# Patient Record
Sex: Female | Born: 1940 | Race: White | Hispanic: No | Smoking: Former smoker
Health system: Southern US, Community
[De-identification: ages and names within clinical notes are randomized; demographics above are authoritative.]

## PROBLEM LIST (undated history)

## (undated) DIAGNOSIS — F419 Anxiety disorder, unspecified: Secondary | ICD-10-CM

## (undated) DIAGNOSIS — E785 Hyperlipidemia, unspecified: Secondary | ICD-10-CM

## (undated) DIAGNOSIS — I10 Essential (primary) hypertension: Secondary | ICD-10-CM

---

## 2006-02-21 ENCOUNTER — Ambulatory Visit: Payer: Self-pay

## 2007-03-28 ENCOUNTER — Ambulatory Visit: Payer: Self-pay

## 2007-03-31 ENCOUNTER — Ambulatory Visit: Payer: Self-pay

## 2007-06-28 ENCOUNTER — Ambulatory Visit: Payer: Self-pay

## 2008-04-02 ENCOUNTER — Ambulatory Visit: Payer: Self-pay

## 2009-01-25 IMAGING — US ULTRASOUND LEFT BREAST
1 series · 11 of 11 positions shown · non-contrast
Comparison: none

REASON FOR EXAM: Left Breast Density
COMMENTS:

[Series 1: ultrasound left breast · 11 of 11 slices shown]
[im 1/11]
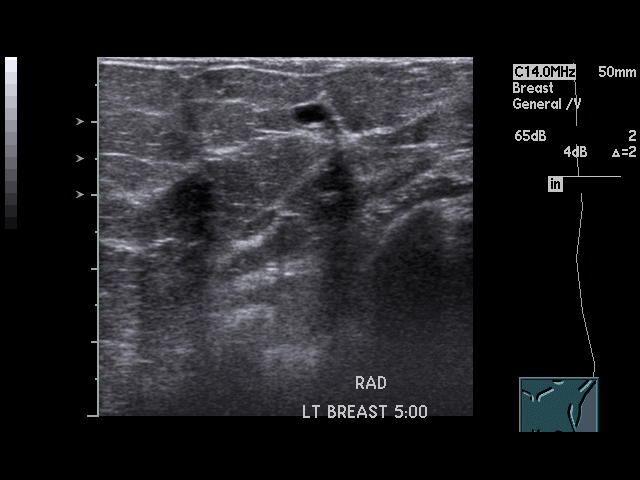
[im 2/11]
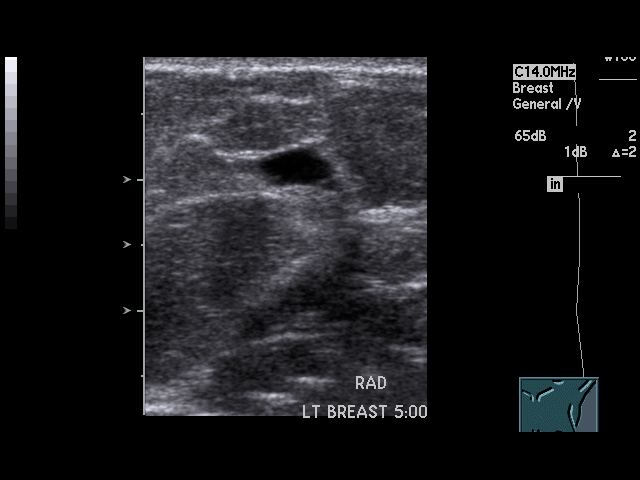
[im 3/11]
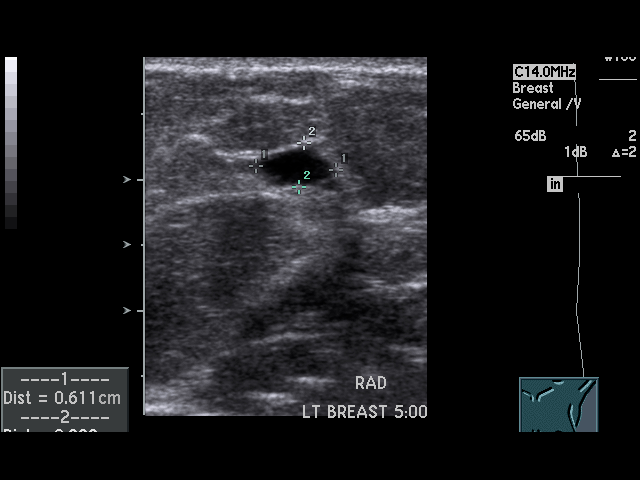
[im 4/11]
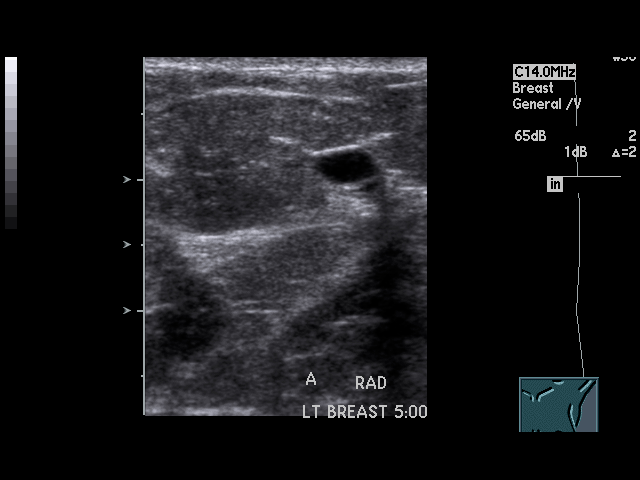
[im 5/11]
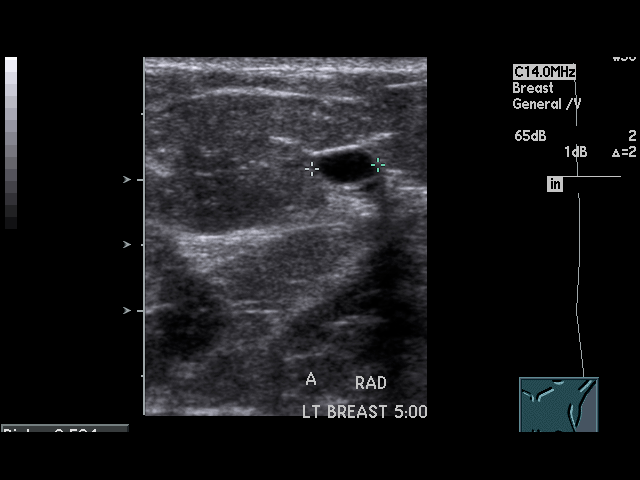
[im 6/11]
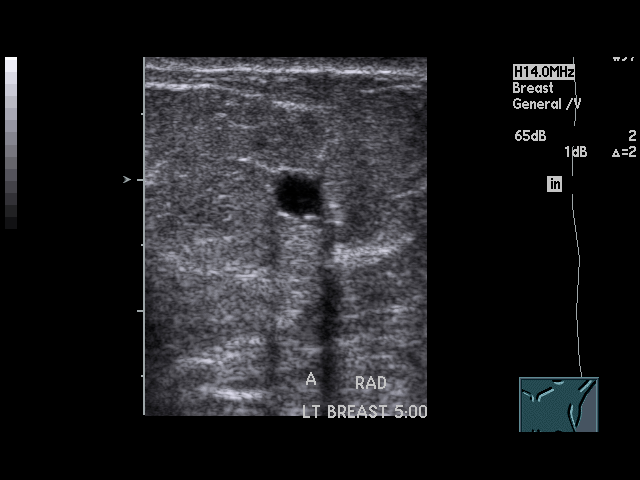
[im 7/11]
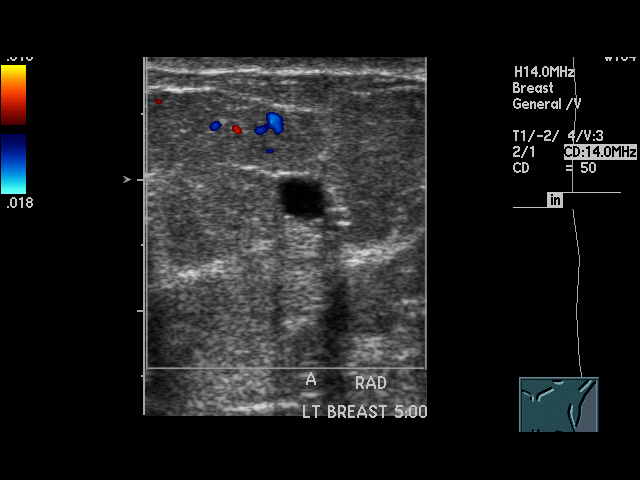
[im 8/11]
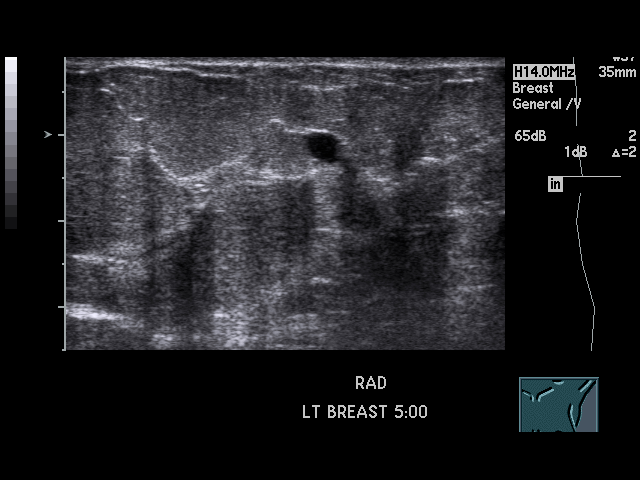
[im 9/11]
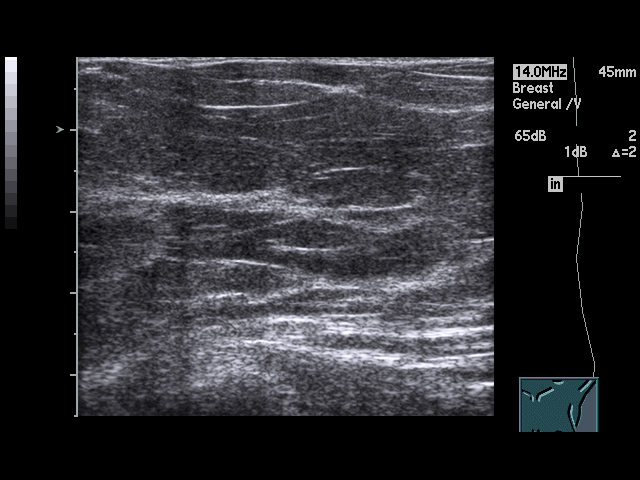
[im 10/11]
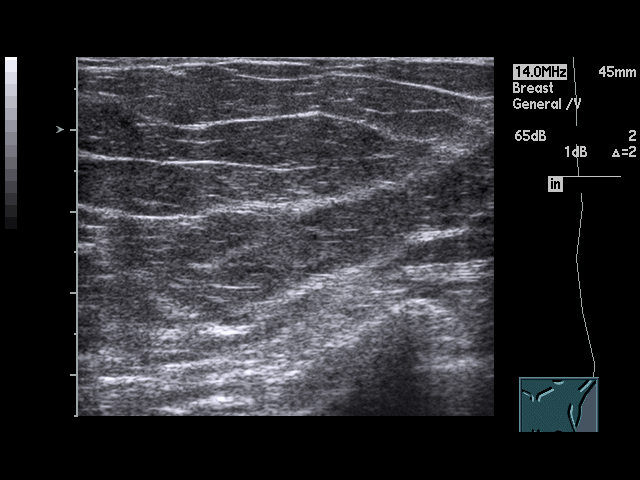
[im 11/11]
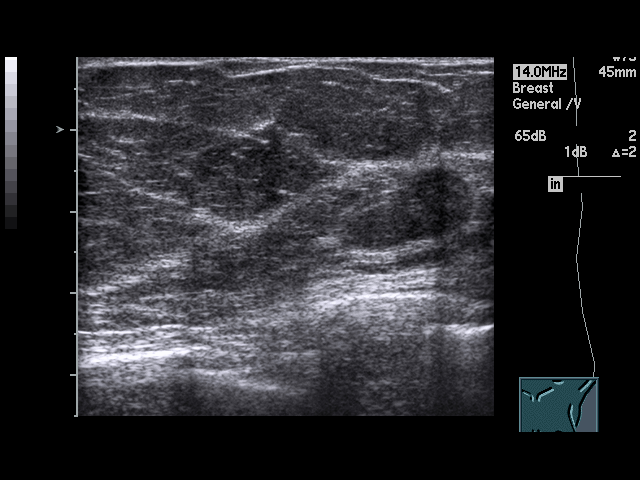

[11 of 11 positions shown; findings below may reference images not displayed]

PROCEDURE:     US  - US BREAST LEFT  - March 31, 2007 [DATE]

RESULT:       Mammographically there is noted a nodule at 5 o'clock in the
LEFT breast.

Ultrasound examination targeted to this site shows a 6.11 mm oval-shaped,
smoothly marginated cyst having posterior enhancement. The findings are
consistent with a simple cyst.  No finding suspicious for malignancy are
seen.
IMPRESSION: 1.     Benign-appearing targeted ultrasound.
2.     BI-RADS:  Category 2-Benign Finding.

A NEGATIVE MAMMOGRAM REPORT DOES NOT PRECLUDE BIOPSY OR OTHER EVALUATION OF
A CLINICALLY PALPABLE OR OTHERWISE SUPSICIOUS MASS OR LESION.  BREAST CANCER
MAY NOT BE DETECTED BY MAMMOGRAPHY IN UP TO 10% OF CASES.

## 2009-04-03 ENCOUNTER — Ambulatory Visit: Payer: Self-pay

## 2009-09-03 ENCOUNTER — Ambulatory Visit: Payer: Self-pay | Admitting: Nurse Practitioner

## 2010-04-07 ENCOUNTER — Ambulatory Visit: Payer: Self-pay | Admitting: Nurse Practitioner

## 2011-04-08 ENCOUNTER — Ambulatory Visit: Payer: Self-pay | Admitting: Nurse Practitioner

## 2011-09-14 ENCOUNTER — Ambulatory Visit: Payer: Self-pay | Admitting: Ophthalmology

## 2011-10-21 ENCOUNTER — Ambulatory Visit: Payer: Self-pay | Admitting: Ophthalmology

## 2011-11-02 ENCOUNTER — Ambulatory Visit: Payer: Self-pay | Admitting: Ophthalmology

## 2012-04-11 ENCOUNTER — Ambulatory Visit: Payer: Self-pay | Admitting: Nurse Practitioner

## 2014-05-07 NOTE — Op Note (Signed)
PATIENT NAME:  Krystal GrumblingCOLLINS, Hadassa N MR#:  [id number] DATE OF BIRTH:  [birth year]  DATE OF PROCEDURE:  11/02/2011  PREOPERATIVE DIAGNOSIS: Visually significant cataract of the left eye.   POSTOPERATIVE DIAGNOSIS: Visually significant cataract of the left eye.   OPERATIVE PROCEDURE: Cataract extraction by phacoemulsification with implant of intraocular lens to left eye.   SURGEON: Galen ManilaWilliam Latrisha Coiro, MD.   ANESTHESIA:  1. Managed anesthesia care.  2. Topical tetracaine drops followed by 2% Xylocaine jelly applied in the preoperative holding area.   COMPLICATIONS: None.   TECHNIQUE:  Stop and chop.   DESCRIPTION OF PROCEDURE: The patient was examined and consented in the preoperative holding area where the aforementioned topical anesthesia was applied to the left eye and then brought back to the Operating Room where the left eye was prepped and draped in the usual sterile ophthalmic fashion and a lid speculum was placed. A paracentesis was created with the side port blade and the anterior chamber was filled with viscoelastic. A near clear corneal incision was performed with the steel keratome. A continuous curvilinear capsulorrhexis was performed with a cystotome followed by the capsulorrhexis forceps. Hydrodissection and hydrodelineation were carried out with BSS on a blunt cannula. The lens was removed in a stop and chop technique and the remaining cortical material was removed with the irrigation-aspiration handpiece. The capsular bag was inflated with viscoelastic and the Tecnis ZCB00-21.5-diopter lens, serial number [id number] was placed in the capsular bag without complication. The remaining viscoelastic was removed from the eye with the irrigation-aspiration handpiece. The wounds were hydrated. The anterior chamber was flushed with Miostat and the eye was inflated to physiologic pressure. The wounds were found to be water tight. The eye was dressed with Vigamox. The patient was given protective glasses  to wear throughout the day and a shield with which to sleep tonight. The patient was also given drops with which to begin a drop regimen today and will follow-up with me in one day.  ____________________________ Jerilee FieldWilliam L. Paz Winsett, MD wlp:slb D: 11/02/2011 11:49:05 ET T: 11/02/2011 11:56:46 ET JOB#: [id number]  cc: Shakiya Mcneary L. Brendia Dampier, MD, <Dictator> Jerilee FieldWILLIAM L Lonie Newsham MD ELECTRONICALLY SIGNED 11/08/2011 13:46

## 2022-12-30 DIAGNOSIS — I609 Nontraumatic subarachnoid hemorrhage, unspecified: Secondary | ICD-10-CM

## 2022-12-30 HISTORY — DX: Nontraumatic subarachnoid hemorrhage, unspecified: I60.9

## 2023-01-07 ENCOUNTER — Encounter: Payer: Self-pay | Admitting: *Deleted

## 2023-01-07 DIAGNOSIS — E041 Nontoxic single thyroid nodule: Secondary | ICD-10-CM

## 2023-01-07 DIAGNOSIS — R911 Solitary pulmonary nodule: Secondary | ICD-10-CM

## 2023-01-07 NOTE — Progress Notes (Signed)
Pt scheduled for new patient visit with Dr. Smith Robert on Mon 01/10/23. Chart reviewed. Noted that imaging was performed at [facility]. Request for images made through power share. Will upload to pt's chart once reviewed for MD review. Will follow up at new pt visit on 12/23. Nothing further needed at this time.

## 2023-01-09 ENCOUNTER — Emergency Department: Payer: Medicare HMO

## 2023-01-09 ENCOUNTER — Other Ambulatory Visit: Payer: Self-pay

## 2023-01-09 ENCOUNTER — Emergency Department
Admission: EM | Admit: 2023-01-09 | Discharge: 2023-01-09 | Disposition: A | Discharge status: Skilled Nursing Facility | Payer: Medicare HMO | Attending: Emergency Medicine | Admitting: Emergency Medicine

## 2023-01-09 ENCOUNTER — Encounter: Payer: Self-pay | Admitting: Emergency Medicine

## 2023-01-09 DIAGNOSIS — I10 Essential (primary) hypertension: Secondary | ICD-10-CM | POA: Diagnosis not present

## 2023-01-09 DIAGNOSIS — K8020 Calculus of gallbladder without cholecystitis without obstruction: Secondary | ICD-10-CM | POA: Insufficient documentation

## 2023-01-09 DIAGNOSIS — I700 Atherosclerosis of aorta: Secondary | ICD-10-CM | POA: Insufficient documentation

## 2023-01-09 DIAGNOSIS — W06XXXA Fall from bed, initial encounter: Secondary | ICD-10-CM | POA: Diagnosis not present

## 2023-01-09 DIAGNOSIS — I2510 Atherosclerotic heart disease of native coronary artery without angina pectoris: Secondary | ICD-10-CM | POA: Diagnosis not present

## 2023-01-09 DIAGNOSIS — R9082 White matter disease, unspecified: Secondary | ICD-10-CM | POA: Diagnosis not present

## 2023-01-09 DIAGNOSIS — R911 Solitary pulmonary nodule: Secondary | ICD-10-CM | POA: Insufficient documentation

## 2023-01-09 DIAGNOSIS — S2241XA Multiple fractures of ribs, right side, initial encounter for closed fracture: Secondary | ICD-10-CM | POA: Diagnosis not present

## 2023-01-09 DIAGNOSIS — G9389 Other specified disorders of brain: Secondary | ICD-10-CM | POA: Insufficient documentation

## 2023-01-09 DIAGNOSIS — S299XXA Unspecified injury of thorax, initial encounter: Secondary | ICD-10-CM | POA: Diagnosis present

## 2023-01-09 DIAGNOSIS — W19XXXA Unspecified fall, initial encounter: Secondary | ICD-10-CM

## 2023-01-09 HISTORY — DX: Essential (primary) hypertension: I10

## 2023-01-09 HISTORY — DX: Hyperlipidemia, unspecified: E78.5

## 2023-01-09 HISTORY — DX: Anxiety disorder, unspecified: F41.9

## 2023-01-09 LAB — BASIC METABOLIC PANEL
Anion gap: 13 (ref 5–15)
BUN: 34 mg/dL — ABNORMAL HIGH (ref 8–23)
CO2: 20 mmol/L — ABNORMAL LOW (ref 22–32)
Calcium: 9.3 mg/dL (ref 8.9–10.3)
Chloride: 104 mmol/L (ref 98–111)
Creatinine, Ser: 1 mg/dL (ref 0.44–1.00)
GFR, Estimated: 56 mL/min — ABNORMAL LOW (ref >60)
Glucose, Bld: 172 mg/dL — ABNORMAL HIGH (ref 70–99)
Potassium: 4.4 mmol/L (ref 3.5–5.1)
Sodium: 137 mmol/L (ref 135–145)

## 2023-01-09 LAB — CBC
HCT: 34.8 % — ABNORMAL LOW (ref 36.0–46.0)
Hemoglobin: 11.7 g/dL — ABNORMAL LOW (ref 12.0–15.0)
MCH: 32.1 pg (ref 26.0–34.0)
MCHC: 33.6 g/dL (ref 30.0–36.0)
MCV: 95.3 fL (ref 80.0–100.0)
Platelets: 243 10*3/uL (ref 150–400)
RBC: 3.65 MIL/uL — ABNORMAL LOW (ref 3.87–5.11)
RDW: 13.1 % (ref 11.5–15.5)
WBC: 7.7 10*3/uL (ref 4.0–10.5)
nRBC: 0 % (ref 0.0–0.2)

## 2023-01-09 LAB — HEPATIC FUNCTION PANEL
ALT: 19 U/L (ref 0–44)
AST: 21 U/L (ref 15–41)
Albumin: 3.6 g/dL (ref 3.5–5.0)
Alkaline Phosphatase: 78 U/L (ref 38–126)
Bilirubin, Direct: <0.1 mg/dL (ref 0.0–0.2)
Total Bilirubin: 0.6 mg/dL (ref <1.2)
Total Protein: 7.4 g/dL (ref 6.5–8.1)

## 2023-01-09 LAB — LIPASE, BLOOD: Lipase: 62 U/L — ABNORMAL HIGH (ref 11–51)

## 2023-01-09 LAB — TROPONIN I (HIGH SENSITIVITY): Troponin I (High Sensitivity): 14 ng/L (ref <18)

## 2023-01-09 MED ORDER — OXYCODONE HCL 5 MG PO TABS: 5.0000 mg | ORAL_TABLET | Freq: Three times a day (TID) | ORAL | 0 refills | Status: DC | PRN

## 2023-01-09 MED ORDER — LIDOCAINE 5 % EX PTCH
1.0000 | MEDICATED_PATCH | CUTANEOUS | Status: DC
  Administered 2023-01-09: 1 via TRANSDERMAL
  Filled 2023-01-09: qty 1

## 2023-01-09 MED ORDER — LIDOCAINE 5 % EX PTCH: 1.0000 | MEDICATED_PATCH | Freq: Two times a day (BID) | CUTANEOUS | 0 refills | Status: DC

## 2023-01-09 MED ORDER — IOHEXOL 300 MG/ML  SOLN
75.0000 mL | Freq: Once | INTRAMUSCULAR | Status: AC | PRN
  Administered 2023-01-09: 65 mL via INTRAVENOUS

## 2023-01-09 MED ORDER — OXYCODONE HCL 5 MG PO TABS
5.0000 mg | ORAL_TABLET | Freq: Once | ORAL | Status: AC
  Administered 2023-01-09: 5 mg via ORAL
  Filled 2023-01-09: qty 1

## 2023-01-09 NOTE — ED Notes (Signed)
[facility] called for transport back to [facility] spoke with Marissa.

## 2023-01-09 NOTE — ED Notes (Signed)
Pt wants to discuss "NOT" being DNR status. MD [place] in formed.

## 2023-01-09 NOTE — ED Triage Notes (Signed)
Pt via [facility] from [facility], reports XR was done confirmed air in the R upper lung. Pt states she has had multiple falls. Denies pain but when she takes a deep breath hr R side hurts. EMS VSS. Pt is A&Ox4 and NAD

## 2023-01-09 NOTE — ED Provider Notes (Signed)
[facility] Provider Note    Event Date/Time   First MD Initiated Contact with Patient 01/09/23 1504     (approximate)   History   Chief Complaint Fall and Shortness of Breath   HPI  Krystal [name] is a 82 y.o. female with past medical history of hypertension, hyperlipidemia, and subarachnoid hemorrhage who presents to the ED complaining of fall.  Patient reports that she lost her balance last night while getting into bed, ended up falling onto her right side.  She hit her head as well as her right chest wall on the floor, denies losing consciousness and does not take a blood thinner.  She has been dealing with pain over her right lower chest wall since then which is worse when she takes a deep breath.  She reports feeling slightly short of breath, but denies any pain in her abdomen.  She had an x-ray performed at her nursing facility earlier today, was told there was "air in my lung" and she was sent to the ED for further evaluation.     Physical Exam   Triage Vital Signs: ED Triage Vitals  Encounter Vitals Group     BP 01/09/23 1448 (!) 140/47     Systolic BP Percentile --      Diastolic BP Percentile --      Pulse Rate 01/09/23 1448 62     Resp 01/09/23 1449 17     Temp 01/09/23 1443 (!) 97.4 F (36.3 C)     Temp Source 01/09/23 1443 Axillary     SpO2 01/09/23 1448 100 %     Weight 01/09/23 1441 96 lb (43.5 kg)     Height 01/09/23 1441 5' (1.524 m)     Head Circumference --      Peak Flow --      Pain Score 01/09/23 1441 0     Pain Loc --      Pain Education --      Exclude from Growth Chart --     Most recent vital signs: Vitals:   01/09/23 1448 01/09/23 1449  BP: (!) 140/47   Pulse: 62   Resp:  17  Temp:    SpO2: 100%     Constitutional: Alert and oriented. Eyes: Conjunctivae are normal. Head: Atraumatic. Nose: No congestion/rhinnorhea. Mouth/Throat: Mucous membranes are moist.  Neck: No midline cervical spine tenderness to  palpation. Cardiovascular: Normal rate, regular rhythm. Grossly normal heart sounds.  2+ radial pulses bilaterally. Respiratory: Normal respiratory effort.  No retractions. Lungs CTAB.  Right chest wall tenderness to palpation noted. Gastrointestinal: Soft and nontender. No distention. Musculoskeletal: No lower extremity tenderness nor edema.  No upper extremity bony tenderness to palpation. Neurologic:  Normal speech and language. No gross focal neurologic deficits are appreciated.    ED Results / Procedures / Treatments   Labs (all labs ordered are listed, but only abnormal results are displayed) Labs Reviewed  BASIC METABOLIC PANEL - Abnormal; Notable for the following components:      Result Value   CO2 20 (*)    Glucose, Bld 172 (*)    BUN 34 (*)    GFR, Estimated 56 (*)    All other components within normal limits  CBC - Abnormal; Notable for the following components:   RBC 3.65 (*)    Hemoglobin 11.7 (*)    HCT 34.8 (*)    All other components within normal limits  LIPASE, BLOOD - Abnormal; Notable for the following  components:   Lipase 62 (*)    All other components within normal limits  HEPATIC FUNCTION PANEL  TROPONIN I (HIGH SENSITIVITY)     EKG  ED ECG REPORT I, Chesley Noon, the attending physician, personally viewed and interpreted this ECG.   Date: 01/09/2023  EKG Time: 14:50  Rate: 63  Rhythm: normal sinus rhythm  Axis: LAD  Intervals:left bundle branch block  ST&T Change: None  RADIOLOGY Chest x-ray reviewed and interpreted by me with elevation of right hemidiaphragm, no focal infiltrate, edema, or effusion noted.  PROCEDURES:  Critical Care performed: No  Procedures   MEDICATIONS ORDERED IN ED: Medications  oxyCODONE (Oxy IR/ROXICODONE) immediate release tablet 5 mg (has no administration in time range)  lidocaine (LIDODERM) 5 % 1 patch (has no administration in time range)  iohexol (OMNIPAQUE) 300 MG/ML solution 75 mL (65 mLs  Intravenous Contrast Given 01/09/23 1624)     IMPRESSION / MDM / ASSESSMENT AND PLAN / ED COURSE  I reviewed the triage vital signs and the nursing notes.                              82 y.o. female with past medical history of hypertension, hyperlipidemia, and subarachnoid hemorrhage who presents to the ED complaining of right chest wall pain after losing her balance and falling last night.  Patient's presentation is most consistent with acute presentation with potential threat to life or bodily function.  Differential diagnosis includes, but is not limited to, intracranial injury, cervical spine injury, rib fracture, hemothorax, pneumothorax, hepatic injury.  Patient uncomfortable but nontoxic-appearing and in no acute distress, vital signs are unremarkable.  There was report of "air in her lung" from chest x-ray performed at her nursing facility, however chest x-ray here in the ED shows no obvious pneumothorax, does show elevation of her the right hemidiaphragm.  We will check CT head and cervical spine, also check CT of her chest/abdomen/pelvis.  EKG shows no evidence of arrhythmia or ischemia and labs are pending at this time.  Patient declines pain medication.  CT head and cervical spine are negative for acute process.  CT chest abdomen and pelvis shows fractures of right ribs 8 through 10 with no associated hemo or pneumothorax.  No evidence of intra-abdominal injury.  No evidence of injury to her extremities.  Labs show no significant anemia, leukocytosis, electrolyte abnormality, or AKI.  Troponin within normal limits and LFTs are also unremarkable.  Patient given dose of oxycodone and Lidoderm patch was placed.  She was offered admission to the hospital but declines, prefers to be discharged back to her rehab facility.  Son is in agreement with plan, she has an appointment at the cancer center tomorrow to follow-up regarding pulmonary nodule seen on today's imaging that she was previously  aware of.  Patient and son counseled to return to the ED for new or worsening symptoms.      FINAL CLINICAL IMPRESSION(S) / ED DIAGNOSES   Final diagnoses:  Closed fracture of multiple ribs of right side, initial encounter  Fall, initial encounter     Rx / DC Orders   ED Discharge Orders          Ordered    oxyCODONE (ROXICODONE) 5 MG immediate release tablet  Every 8 hours PRN        01/09/23 1707    lidocaine (LIDODERM) 5 %  Every 12 hours  01/09/23 1707    AMB  Referral to Pulmonary Nodule Clinic        01/09/23 1708             Note:  This document was prepared using Dragon voice recognition software and may include unintentional dictation errors.   Chesley Noon, MD 01/09/23 [phone]

## 2023-01-10 ENCOUNTER — Inpatient Hospital Stay: Payer: Medicare HMO | Attending: Oncology | Admitting: Oncology

## 2023-01-10 ENCOUNTER — Ambulatory Visit
Admission: RE | Admit: 2023-01-10 | Discharge: 2023-01-10 | Disposition: A | Discharge status: Home / Self Care | Payer: Self-pay | Source: Ambulatory Visit | Attending: Oncology

## 2023-01-10 ENCOUNTER — Encounter: Payer: Self-pay | Admitting: Oncology

## 2023-01-10 ENCOUNTER — Inpatient Hospital Stay: Payer: Medicare HMO

## 2023-01-10 ENCOUNTER — Encounter: Payer: Self-pay | Admitting: *Deleted

## 2023-01-10 VITALS — BP 124/58 | HR 57 | Temp 97.4°F | Resp 18 | Wt 100.0 lb

## 2023-01-10 DIAGNOSIS — E041 Nontoxic single thyroid nodule: Secondary | ICD-10-CM

## 2023-01-10 DIAGNOSIS — R911 Solitary pulmonary nodule: Secondary | ICD-10-CM

## 2023-01-10 NOTE — Progress Notes (Signed)
Met with patient and her son during initial consult with Dr. Smith Robert. All questions answered during visit. Reviewed upcoming appts. Informed will be in touch with further recommendations after PET and tumor board discussion. Son verbalized understanding. Nothing further needed at this time.

## 2023-01-10 NOTE — Addendum Note (Signed)
Addended by: Glory Buff on: 01/10/2023 08:56 AM   Modules accepted: Orders

## 2023-01-13 ENCOUNTER — Encounter: Payer: Self-pay | Admitting: Oncology

## 2023-01-13 NOTE — Progress Notes (Signed)
Hematology/Oncology Consult note [facility] Telephone:([phone] [phone]  Patient Care Team: Keane Police, MD as PCP - General (Family Medicine) Creig Hines, MD as Consulting Physician (Oncology) Glory Buff, RN as Oncology Nurse Navigator   Name of the patient: Krystal [name]  [id number]  Mar 13, 1940    Reason for referral-lung nodule   Referring physician-Dr. Keane Police  Date of visit: 01/13/23   History of presenting illness- Patient is a 82 year old female with a past medical history significant for hypertension hyperlipidemia and longstanding history of smoking who had a fall at home sometime in early December 2024.  Following that patient was in a rehab and had a second fall at the rehab on 01/09/2023 and was brought to the ER.  She underwent CT chest abdomen and pelvis with contrast at that time which incidentally showed a spiculated nodule in the inferior right middle lobe measuring 1.8 x 1.5 cm.  No evidence of enlarged mediastinal hilar or axillary adenopathy or distant metastatic disease.  Minimally displaced fractures of the posterolateral right 8th through 10th ribs.  Prescribed as needed oxycodone for pain from her rib fracture and therefore appears significantly drowsy today.  She is easily arousable.  She is here with her son.  Prior to her initial fall in early December 2024 patient was living alone and was independent of her ADLs and IADLs.  Since her last couple of falls her mobility has decreased  ECOG PS- 3  Pain scale- 4   Review of systems- Review of Systems  Constitutional:  Positive for malaise/fatigue. Negative for chills, fever and weight loss.  HENT:  Negative for congestion, ear discharge and nosebleeds.   Eyes:  Negative for blurred vision.  Respiratory:  Negative for cough, hemoptysis, sputum production, shortness of breath and wheezing.   Cardiovascular:  Negative for chest pain,  palpitations, orthopnea and claudication.  Gastrointestinal:  Negative for abdominal pain, blood in stool, constipation, diarrhea, heartburn, melena, nausea and vomiting.  Genitourinary:  Negative for dysuria, flank pain, frequency, hematuria and urgency.  Musculoskeletal:  Negative for back pain, joint pain and myalgias.  Skin:  Negative for rash.  Neurological:  Negative for dizziness, tingling, focal weakness, seizures, weakness and headaches.  Endo/Heme/Allergies:  Does not bruise/bleed easily.  Psychiatric/Behavioral:  Negative for depression and suicidal ideas. The patient does not have insomnia.     No Known Allergies  There are no active problems to display for this patient.    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 No pertinent surgical history.  Social History   Socioeconomic History   Marital status: Divorced    Spouse name: Not on file   Number of children: Not on file   Years of education: Not on file   Highest education level: Not on file  Occupational History   Not on file  Tobacco Use   Smoking status: Former    Current packs/day: 0.00    Average packs/day: 1 pack/day for 24.0 years (24.0 ttl pk-yrs)    Types: Cigarettes    Start date: 77    Quit date: 21    Years since quitting: 41.0   Smokeless tobacco: Never  Vaping Use   Vaping status: Never Used  Substance and Sexual Activity   Alcohol use: Never   Drug use: Yes    Types: Oxycodone  Comment: for pain management due to fall   Sexual activity: Not on file  Other Topics Concern   Not on file  Social History Narrative   Not on file   Social Drivers of Health   Financial Resource Strain: Low Risk  (12/27/2022)   Received from [facility]   Overall Financial Resource Strain (CARDIA)    Difficulty of Paying Living Expenses: Not hard at all  Food Insecurity: No Food  Insecurity (01/10/2023)   Hunger Vital Sign    Worried About Running Out of Food in the Last Year: Never true    Ran Out of Food in the Last Year: Never true  Transportation Needs: No Transportation Needs (01/10/2023)   PRAPARE - Administrator, Civil Service (Medical): No    Lack of Transportation (Non-Medical): No  Physical Activity: Not on file  Stress: Not on file  Social Connections: Not on file  Intimate Partner Violence: Not At Risk (01/10/2023)   Humiliation, Afraid, Rape, and Kick questionnaire    Fear of Current or Ex-Partner: No    Emotionally Abused: No    Physically Abused: No    Sexually Abused: No     Family History  Problem Relation Age of Onset   Heart disease Mother    Diabetes Father    Heart disease Father    Breast cancer Sister    Breast cancer Sister      Current Outpatient Medications:    alendronate (FOSAMAX) 70 MG tablet, Take 70 mg by mouth once a week., Disp: , Rfl:    amLODipine (NORVASC) 5 MG tablet, Take 1 tablet by mouth daily., Disp: , Rfl:    aspirin EC 81 MG tablet, Take 81 mg by mouth daily., Disp: , Rfl:    diclofenac Sodium (VOLTAREN) 1 % GEL, Apply 2 g topically 4 (four) times daily., Disp: , Rfl:    enalapril (VASOTEC) 20 MG tablet, Take 0.5 tablets by mouth daily., Disp: , Rfl:    lidocaine (LIDODERM) 5 %, Place 1 patch onto the skin every 12 (twelve) hours. Remove & Discard patch within 12 hours or as directed by MD, Disp: 10 patch, Rfl: 0   Multiple Vitamins-Minerals (PRESERVISION AREDS) CAPS, Take 1 capsule by mouth daily., Disp: , Rfl:    Nutritional Supplements (SALMON OIL PO), Take 1 capsule by mouth daily., Disp: , Rfl:    Omega-3 1000 MG CAPS, Take 1 capsule by mouth daily., Disp: , Rfl:    oxyCODONE (ROXICODONE) 5 MG immediate release tablet, Take 1 tablet (5 mg total) by mouth every 8 (eight) hours as needed., Disp: 20 tablet, Rfl: 0   pregabalin (LYRICA) 25 MG capsule, Take 25 mg by mouth 2 (two) times daily.,  Disp: , Rfl:    sertraline (ZOLOFT) 50 MG tablet, Take 1 tablet by mouth daily., Disp: , Rfl:    simvastatin (ZOCOR) 20 MG tablet, Take 1 tablet by mouth at bedtime., Disp: , Rfl:    Physical exam:  Vitals:   01/10/23 1047  BP: (!) 124/58  Pulse: (!) 57  Resp: 18  Temp: (!) 97.4 F (36.3 C)  Weight: 100 lb (45.4 kg)   Physical Exam Cardiovascular:     Rate and Rhythm: Normal rate and regular rhythm.     Heart sounds: Murmur heard.  Pulmonary:     Effort: Pulmonary effort is normal.     Breath sounds: Normal breath sounds.  Abdominal:     General: Bowel sounds are normal.  Palpations: Abdomen is soft.  Skin:    General: Skin is warm and dry.  Neurological:     Mental Status: She is alert and oriented to person, place, and time.           Latest Ref Rng & Units 01/09/2023    3:00 PM  CMP  Total Protein 6.5 - 8.1 g/dL 7.4   Total Bilirubin <0.4 mg/dL 0.6   Alkaline Phos 38 - 126 U/L 78   AST 15 - 41 U/L 21   ALT 0 - 44 U/L 19       Latest Ref Rng & Units 01/09/2023    2:56 PM  CBC  WBC 4.0 - 10.5 K/uL 7.7   Hemoglobin 12.0 - 15.0 g/dL 54.0   Hematocrit 98.1 - 46.0 % 34.8   Platelets 150 - 400 K/uL 243     No images are attached to the encounter.  CT CHEST ABDOMEN PELVIS W CONTRAST Result Date: 01/09/2023 CLINICAL DATA:  Multiple falls, pain * Tracking Code: BO * EXAM: CT CHEST, ABDOMEN, AND PELVIS WITH CONTRAST TECHNIQUE: Multidetector CT imaging of the chest, abdomen and pelvis was performed following the standard protocol during bolus administration of intravenous contrast. RADIATION DOSE REDUCTION: This exam was performed according to the departmental dose-optimization program which includes automated exposure control, adjustment of the mA and/or kV according to patient size and/or use of iterative reconstruction technique. CONTRAST:  65mL OMNIPAQUE IOHEXOL 300 MG/ML  SOLN COMPARISON:  None Available. FINDINGS: CT CHEST FINDINGS Cardiovascular: Aortic  atherosclerosis. Dense aortic valve calcifications. Normal heart size. Left coronary artery calcifications. No pericardial effusion. Mediastinum/Nodes: No enlarged mediastinal, hilar, or axillary lymph nodes. Trachea and esophagus demonstrate no significant findings. Lungs/Pleura: Lobulated, finely spiculated appearing nodule of the inferior right middle lobe measuring 1.8 x 1.5 cm (series 4, image 80). Elevation of the right hemidiaphragm with associated scarring or atelectasis. No pleural effusion or pneumothorax. Musculoskeletal: No chest wall abnormality. Minimally displaced fractures of the posterolateral right eighth through tenth ribs (series 4, image 110). CT ABDOMEN PELVIS FINDINGS Hepatobiliary: No solid liver abnormality is seen. Gallstones. No gallbladder wall thickening, or biliary dilatation. Pancreas: Unremarkable. No pancreatic ductal dilatation or surrounding inflammatory changes. Spleen: Normal in size without significant abnormality. Adrenals/Urinary Tract: Adrenal glands are unremarkable. Kidneys are normal, without renal calculi, solid lesion, or hydronephrosis. Small volume air within the urinary bladder, presumably secondary to recent catheterization Stomach/Bowel: Stomach is within normal limits. Appendix not clearly visualized. No evidence of bowel wall thickening, distention, or inflammatory changes. Vascular/Lymphatic: Aortic atherosclerosis. No enlarged abdominal or pelvic lymph nodes. Reproductive: Status post hysterectomy. Other: No abdominal wall hernia or abnormality. No ascites. Musculoskeletal: No acute osseous findings. 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 Decubitus ulcerations (series 2, image 111). IMPRESSION: 1. Minimally displaced fractures of the posterolateral right eighth through tenth ribs. No pneumothorax. 2. No CT evidence of acute traumatic injury to the organs of the chest, abdomen, or pelvis. 3. Lobulated, finely  spiculated appearing nodule of the inferior right middle lobe measuring 1.8 x 1.5 cm. This is concerning for malignancy. Recommend multidisciplinary thoracic oncology referral on a nonemergent, outpatient basis for consideration of PET-CT, bronchoscopy, and tissue sampling as clinically appropriate. 4. Cholelithiasis. 5. Dense aortic valve calcifications. Correlate for echocardiographic evidence of aortic valve dysfunction. 6. Coronary artery disease. 7. Decubitus ulcerations. Aortic Atherosclerosis (ICD10-I70.0). Electronically Signed   By: Jearld Lesch M.D.   On: 01/09/2023 16:52   CT Head Wo Contrast Result Date:  01/09/2023 CLINICAL DATA:  Multiple falls right-sided chest pain EXAM: CT HEAD WITHOUT CONTRAST CT CERVICAL SPINE WITHOUT CONTRAST TECHNIQUE: Multidetector CT imaging of the head and cervical spine was performed following the standard protocol without intravenous contrast. Multiplanar CT image reconstructions of the cervical spine were also generated. RADIATION DOSE REDUCTION: This exam was performed according to the departmental dose-optimization program which includes automated exposure control, adjustment of the mA and/or kV according to patient size and/or use of iterative reconstruction technique. COMPARISON:  None Available. FINDINGS: CT HEAD FINDINGS Brain: No evidence of acute infarction, hemorrhage, hydrocephalus, extra-axial collection or mass lesion/mass effect. 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 Mild periventricular white matter hypodensity. Vascular: No hyperdense vessel or unexpected calcification. Skull: Normal. Negative for fracture or focal lesion. Sinuses/Orbits: No acute finding. Other: None. CT CERVICAL SPINE FINDINGS Alignment: Degenerative straightening of the normal cervical lordosis. Skull base and vertebrae: No acute fracture. No primary bone lesion or focal pathologic process. Soft tissues and spinal canal: No prevertebral fluid or  swelling. No visible canal hematoma. Disc levels: Moderate disc space height loss and osteophytosis from C5-C7 with otherwise preserved disc spaces. Upper chest: Negative. Other: None. IMPRESSION: 1. No acute intracranial pathology. 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 2. No fracture or static subluxation of the cervical spine. 3. 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 Electronically Signed   By: Jearld Lesch M.D.   On: 01/09/2023 16:43   CT Cervical Spine Wo Contrast Result Date: 01/09/2023 CLINICAL DATA:  Multiple falls right-sided chest pain EXAM: CT HEAD WITHOUT CONTRAST CT CERVICAL SPINE WITHOUT CONTRAST TECHNIQUE: Multidetector CT imaging of the head and cervical spine was performed following the standard protocol without intravenous contrast. Multiplanar CT image reconstructions of the cervical spine were also generated. RADIATION DOSE REDUCTION: This exam was performed according to the departmental dose-optimization program which includes automated exposure control, adjustment of the mA and/or kV according to patient size and/or use of iterative reconstruction technique. COMPARISON:  None Available. FINDINGS: CT HEAD FINDINGS Brain: No evidence of acute infarction, hemorrhage, hydrocephalus, extra-axial collection or mass lesion/mass effect. 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 Mild periventricular white matter hypodensity. Vascular: No hyperdense vessel or unexpected calcification. Skull: Normal. Negative for fracture or focal lesion. Sinuses/Orbits: No acute finding. Other: None. CT CERVICAL SPINE FINDINGS Alignment: Degenerative straightening of the normal cervical lordosis. Skull base and vertebrae: No acute fracture. No primary bone lesion or focal pathologic process. Soft tissues and spinal canal: No prevertebral fluid or swelling. No visible canal  hematoma. Disc levels: Moderate disc space height loss and osteophytosis from C5-C7 with otherwise preserved disc spaces. Upper chest: Negative. Other: None. IMPRESSION: 1. No acute intracranial pathology. 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 2. No fracture or static subluxation of the cervical spine. 3. 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 Electronically Signed   By: Jearld Lesch M.D.   On: 01/09/2023 16:43   DG Chest Portable 1 View Result Date: 01/09/2023 CLINICAL DATA:  rib pain chest pain, multiple falls EXAM: PORTABLE CHEST 1 VIEW COMPARISON:  None Available. FINDINGS: Normal heart size. Normal mediastinal contour. No pneumothorax. No pleural effusion. Mild-to-moderate elevation of the right hemidiaphragm. No pulmonary edema. Mild passive right lung base atelectasis. No consolidative airspace disease. No displaced fractures in the visualized chest. IMPRESSION: Mild-to-moderate elevation of the  right hemidiaphragm with mild passive right lung base atelectasis. Electronically Signed   By: Delbert Phenix M.D.   On: 01/09/2023 15:26    Assessment and plan- Patient is a 82 y.o. female referred for spiculated mass middle lobe lung nodule  I have reviewed CT chest abdomen and pelvis images independently and discussed findings with the patient which shows a spiculated lung nodule about 1.8 cm in the right middle lobe concerning for malignancy.  I am referring her to pulmonary for consideration for possible bronchoscopy.  She does have aortic stenosis and a significant murmur on exam and may need cardiology clearance prior to procedure.  I am also scheduling PET CT scan to complete her staging workup.  Based on pulmonary recommendations we will need to decide if patient can get bronchoscopy followed by consideration for SBRT versus empiric SBRT if she is deemed to be high risk for bronchoscopy.  She does  not require any oxygen at baseline.  Follow-up with me to be decided based on pulmonary recommendations and PET scan   Thank you for this kind referral and the opportunity to participate in the care of this patient   Visit Diagnosis 1. Lung nodule   2. Thyroid nodule     Dr. Owens Shark, MD, MPH [facility] [phone] 01/13/2023

## 2023-01-17 ENCOUNTER — Encounter
Admission: RE | Admit: 2023-01-17 | Discharge: 2023-01-17 | Disposition: A | Discharge status: Home / Self Care | Payer: Medicare HMO | Source: Ambulatory Visit | Attending: Oncology | Admitting: Oncology

## 2023-01-17 DIAGNOSIS — R911 Solitary pulmonary nodule: Secondary | ICD-10-CM | POA: Insufficient documentation

## 2023-01-17 DIAGNOSIS — X58XXXA Exposure to other specified factors, initial encounter: Secondary | ICD-10-CM | POA: Diagnosis not present

## 2023-01-17 DIAGNOSIS — E041 Nontoxic single thyroid nodule: Secondary | ICD-10-CM | POA: Insufficient documentation

## 2023-01-17 DIAGNOSIS — S2241XA Multiple fractures of ribs, right side, initial encounter for closed fracture: Secondary | ICD-10-CM | POA: Insufficient documentation

## 2023-01-17 LAB — GLUCOSE, CAPILLARY: Glucose-Capillary: 83 mg/dL (ref 70–99)

## 2023-01-17 MED ORDER — FLUDEOXYGLUCOSE F - 18 (FDG) INJECTION
5.6400 | Freq: Once | INTRAVENOUS | Status: AC | PRN
  Administered 2023-01-17: 5.64 via INTRAVENOUS

## 2023-01-25 ENCOUNTER — Ambulatory Visit: Payer: Medicare PPO | Admitting: Pulmonary Disease

## 2023-01-25 ENCOUNTER — Encounter: Payer: Self-pay | Admitting: Pulmonary Disease

## 2023-01-25 VITALS — BP 130/80 | HR 59 | Temp 97.3°F | Ht 60.0 in | Wt 100.8 lb

## 2023-01-25 DIAGNOSIS — I350 Nonrheumatic aortic (valve) stenosis: Secondary | ICD-10-CM

## 2023-01-25 DIAGNOSIS — R54 Age-related physical debility: Secondary | ICD-10-CM | POA: Diagnosis not present

## 2023-01-25 DIAGNOSIS — R911 Solitary pulmonary nodule: Secondary | ICD-10-CM

## 2023-01-25 NOTE — Progress Notes (Signed)
 Subjective:    Patient ID: Krystal [name], female    DOB: [birth year], 83 y.o.   MRN: [id number]  Patient Care Team: Krystal Gladden, MD as PCP - General (Family Medicine) Krystal Krystal BROCKS, MD as Consulting Physician (Oncology) Krystal Gills, RN as Oncology Nurse Navigator Krystal Aran, MD as Consulting Physician (Radiation Oncology)  Chief Complaint  Patient presents with   Consult    Nodule. No SOB, wheezing or cough.    BACKGROUND: 83 year old remote former smoker with a 24-pack-year history of smoking who presents for evaluation of a right lower lobe lung nodule for consideration of robotic assisted bronchoscopy.  Patient has a history of very severe aortic stenosis, generalized weakness and frailty and recent current falls.  Patient presents with her son in law today.  She is kindly referred by Dr. Annah Krystal.   HPI Discussed the use of AI scribe software for clinical note transcription with the patient, who gave verbal consent to proceed.  History of Present Illness   Krystal [name], an 83 year old former smoker, presents for evaluation of an incidentally noted lung nodule on a recent CT chest. The patient has a history of severe aortic stenosis and has recently experienced frequent falls, which are likely related to the aortic stenosis. The patient's mobility is impaired, with difficulty in foot coordination and strength, particularly during pivoting movements. This has resulted in several falls, requiring assistance to regain an upright position.  The patient's recent falls led to the incidental discovery of a lung nodule during imaging. The patient's family reports that in the past three to four months, the patient has experienced episodes of dizziness and feeling hot, necessitating sitting down. These symptoms are likely related to the patient's severe aortic stenosis.  The patient's cardiac condition has been monitored by a cardiologist for several years, with an  upcoming appointment scheduled. The patient has no reported skin sores or breakdowns. The patient's family has been assisting with various appointments and medical management.   The patient does not endorse any cough, no sputum production, no hemoptysis.  No lower extremity edema.  Her mobility is limited and she requires a walker for assistance and even with that she is very tentative.     Review of Systems A 10 point review of systems was performed and it is as noted above otherwise negative.   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 No pertinent surgical history.  There are no active problems to display for this patient.   Family History  Problem Relation Age of Onset   Heart disease Mother    Diabetes Father    Heart disease Father    Breast cancer Sister    Breast cancer Sister     Social History   Tobacco Use   Smoking status: Former    Current packs/day: 0.00    Average packs/day: 1 pack/day for 24.0 years (24.0 ttl pk-yrs)    Types: Cigarettes    Start date: 17    Quit date: 1984    Years since quitting: 41.0   Smokeless tobacco: Never  Substance Use Topics   Alcohol use: Never    Allergies  Allergen Reactions   Rsv Mrna Pre-F Virus Vaccine     Body aches and pain, nausea and weakness    Current Meds  Medication Sig   alendronate (FOSAMAX) 70 MG tablet Take 70  mg by mouth once a week.   amLODipine (NORVASC) 5 MG tablet Take 1 tablet by mouth daily.   aspirin  EC 81 MG tablet Take 81 mg by mouth daily.   diclofenac Sodium (VOLTAREN) 1 % GEL Apply 2 g topically 4 (four) times daily.   enalapril (VASOTEC) 20 MG tablet Take 0.5 tablets by mouth daily.   lidocaine  (LIDODERM ) 5 % Place 1 patch onto the skin every 12 (twelve) hours. Remove & Discard patch within 12 hours or as directed by MD   Multiple Vitamins-Minerals (PRESERVISION AREDS) CAPS  Take 1 capsule by mouth daily.   Nutritional Supplements (SALMON OIL PO) Take 1 capsule by mouth daily.   Omega-3 1000 MG CAPS Take 1 capsule by mouth daily.   oxyCODONE  (ROXICODONE ) 5 MG immediate release tablet Take 1 tablet (5 mg total) by mouth every 8 (eight) hours as needed.   pregabalin  (LYRICA ) 25 MG capsule Take 25 mg by mouth 2 (two) times daily.   sertraline  (ZOLOFT ) 50 MG tablet Take 1 tablet by mouth daily.   simvastatin  (ZOCOR ) 20 MG tablet Take 1 tablet by mouth at bedtime.    Immunization History  Administered Date(s) Administered   Influenza-Unspecified 09/11/2022   Moderna Covid-19 Fall Seasonal Vaccine 10yrs & older 11/03/2021   Moderna Sars-Covid-2 Vaccination 08/13/2019, 09/12/2019, 06/11/2020   Unspecified SARS-COV-2 Vaccination 10/13/2020        Objective:     BP 130/80 (BP Location: Right Arm, Cuff Size: Normal)   Pulse (!) 59   Temp (!) 97.3 F (36.3 C)   Ht 5' (1.524 m)   Wt 100 lb 12.8 oz (45.7 kg)   SpO2 96%   BMI 19.69 kg/m   SpO2: 96 % O2 Device: None (Room air)  GENERAL: Frail, debilitated appearing woman, no acute respiratory distress, she ambulates with assistance of a walker.  Gait is unsteady. HEAD: Normocephalic, atraumatic.  EYES: Pupils equal, round, reactive to light.  No scleral icterus.  MOUTH: Oral mucosa moist.  No thrush. NECK: Supple. No thyromegaly. Trachea midline. No JVD.  No adenopathy. PULMONARY: Good air entry bilaterally.  No adventitious sounds. CARDIOVASCULAR: S1 and S2. Regular rate and rhythm.  Grade 3/6 to 4/6 aortic stenosis murmur. ABDOMEN: Benign. MUSCULOSKELETAL: Significant kyphosis.  No joint deformity, no clubbing, no edema.  NEUROLOGIC: Unsteady gait, requires walker for assistance.  Poor balance.  Speech is fluent.  No overt localizing sign. SKIN: Intact,warm,dry. PSYCH: Mood and behavior normal.   Representative image from CT performed on 10 January 2023 at [facility] showing right lower lobe lung  nodule:   Representative image of PET/CT performed 17 January 2023 showing lesion on the right lower lobe being PET avid:    Assessment & Plan:     ICD-10-CM   1. Lung nodule seen on imaging study  R91.1    Likely carcinoma, PET avid    2. Symptomatic severe aortic stenosis with normal ejection fraction  I35.0     3. 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 Biopsy is high risk due to severe aortic stenosis and anatomical considerations. Radiation therapy recommended as a safer alternative, involving five targeted treatments, avoiding general anesthesia risks such as precipitous blood pressure drop and stroke. Radiation oncologist Dr. Lenn is experienced in treating patients who cannot undergo biopsy. - Refer to Dr. Chrystal for radiation therapy consultation -  Schedule five radiation treatments  Severe Aortic Stenosis Severe aortic stenosis with a very narrow valve, monitored by cardiologist Dr. Sedalia. Recent dizziness and falls likely related to this condition. High risk for general anesthesia due to potential for precipitous blood pressure drop and stroke. Valve repair possible but risky given age and comorbidities. - Continue follow-up with cardiologist Dr. Sedalia - Monitor for symptoms of dizziness and falls  Frequent Falls Frequent falls likely due to severe aortic stenosis and lower extremity weakness. Requires assistance to get up after falls. Emphasized importance of using a walker to prevent falls. - Encourage use of walker to prevent falls - Monitor for further falls and assess need for additional support  General Health Maintenance No new sores or pressure ulcers noted. Previous sacral area breakdown appears healed. - Monitor for new sores or pressure ulcers  Follow-up - Keep appointment with cardiologist on March 25 - Return to  clinic if any new lung issues arise - Send message to [facility], RN, oncology Lung Cancer Navigator, to schedule appointment with Dr. Lenn. - Additionally, patient will be discussed at Tumor Board on 9 January     Advised if symptoms do not improve or worsen, to please contact office for sooner follow up or seek emergency care.    I spent 60 minutes of dedicated to the care of this patient on the date of this encounter to include pre-visit review of records, face-to-face time with the patient discussing conditions above, post visit ordering of testing, clinical documentation with the electronic health record, making appropriate referrals as documented, and communicating necessary findings to members of the patients care team.   C. Leita Sanders, MD Advanced Bronchoscopy PCCM [facility]    *This note was dictated using voice recognition software/Dragon.  Despite best efforts to proofread, errors can occur which can change the meaning. Any transcriptional errors that result from this process are unintentional and may not be fully corrected at the time of dictation.

## 2023-01-25 NOTE — Patient Instructions (Addendum)
 VISIT SUMMARY:  During today's visit, we discussed the incidental finding of a lung nodule on your recent CT chest scan, which has shown increased activity on a PET CT scan. We also reviewed your severe aortic stenosis and its impact on your recent falls and episodes of dizziness. Additionally, we talked about the importance of using a walker to prevent further falls and maintaining your general health.  YOUR PLAN:  -LUNG NODULE: A lung nodule is a small growth in the lung that can be benign or malignant. Your recent scans suggest a high risk of malignancy. Due to the high risk associated with a biopsy, we recommend radiation therapy as a safer alternative.  We are referring you to Dr. Chrystal, for consideration of a type of radiation called SBRT which is conducted in 5 treatments.  -SEVERE AORTIC STENOSIS: Severe aortic stenosis is a condition where the heart's aortic valve is very narrow, making it difficult for blood to flow through. This condition is likely causing/contributing to your dizziness and falls. We will continue to monitor this with your cardiologist, Dr. Sedalia, and watch for any new symptoms.  -FREQUENT FALLS: Your frequent falls are likely due to or contributed to by your severe aortic stenosis and lower extremity weakness. It is important to use a walker to help prevent falls. We will continue to monitor your condition and assess if you need additional support.  -GENERAL HEALTH MAINTENANCE: No new sores or pressure ulcers were noted, and your previous sacral area breakdown appears healed. We will continue to monitor for any new sores or pressure ulcers.  INSTRUCTIONS:  Please keep your appointment with your cardiologist, Dr. Sedalia, on March 25. We will also send a message to [facility], the navigator, to schedule your appointment with Dr. Chrystal for radiation therapy. Return to the clinic if you experience any new lung issues.

## 2023-01-27 ENCOUNTER — Other Ambulatory Visit: Payer: Medicare HMO

## 2023-01-28 ENCOUNTER — Encounter: Payer: Self-pay | Admitting: *Deleted

## 2023-01-28 NOTE — Progress Notes (Signed)
 Phone call made to pt's son, Marcey, to review recommendations from tumor board. All questions answered during call. Reviewed upcoming appt to see Dr. Lenn on 1/14 at 10am to discuss radiation therapy. Marcey confirmed appt. Instructed to call back with any questions or needs. Marcey verbalized understanding. Nothing further needed at this time.

## 2023-02-01 ENCOUNTER — Encounter: Payer: Self-pay | Admitting: *Deleted

## 2023-02-01 ENCOUNTER — Ambulatory Visit
Admission: RE | Admit: 2023-02-01 | Discharge: 2023-02-01 | Disposition: A | Discharge status: Home / Self Care | Payer: Medicare PPO | Source: Ambulatory Visit | Attending: Radiation Oncology | Admitting: Radiation Oncology

## 2023-02-01 ENCOUNTER — Encounter: Payer: Self-pay | Admitting: Radiation Oncology

## 2023-02-01 VITALS — BP 136/85 | HR 62 | Temp 97.0°F | Resp 12 | Wt 101.0 lb

## 2023-02-01 DIAGNOSIS — C3481 Malignant neoplasm of overlapping sites of right bronchus and lung: Secondary | ICD-10-CM | POA: Insufficient documentation

## 2023-02-01 DIAGNOSIS — R911 Solitary pulmonary nodule: Secondary | ICD-10-CM

## 2023-02-01 NOTE — Progress Notes (Signed)
 Met with patient during initial consult with Dr. Lenn. All questions answered during visit. Reviewed upcoming appts. Instructed to call with any further questions or needs. Pt and son-in-law, Marcey, verbalized understanding. Nothing further needed at this time.

## 2023-02-01 NOTE — Consult Note (Signed)
 NEW PATIENT EVALUATION  Name: Krystal [name]  MRN: [id number]  Date:   02/01/2023     DOB: 1941/01/11   This 83 y.o. female patient presents to the clinic for initial evaluation of probable stage I non-small cell lung cancer of the right middle lobe.  REFERRING PHYSICIAN: Melanee Annah BROCKS, MD  CHIEF COMPLAINT:  Chief Complaint  Patient presents with   Lung Lesion    DIAGNOSIS: The encounter diagnosis was Lung nodule.   PREVIOUS INVESTIGATIONS:  CT scans PET CT scan reviewed Clinical notes reviewed Labs reviewed Case presented at weekly tumor conference  HPI: Patient is a 83 year old female nonsurgical candidate who has been followed by a right middle lobe nodule that was discovered incidentally at the time of a fall at home in early December which included a CT scan of her chest.  At that time there was 1.8 x 1.5 cm spiculated nodule in the inferior right middle lobe.  No evidence of hilar or mediastinal adenopathy was noted.  She did based on the fall have displaced fractures of the posterior right 8th through 10th ribs.  PET CT scan was performed showing hypermetabolic activity consistent with primary bronchogenic carcinoma.  Based on her age and difficulty in biopsy not thought to be a surgical candidate case was presented at tumor board with recommendation for SBRT treatment.  She is seen today and is doing fairly well.  Specifically [place] cough hemoptysis chest tightness.  PLANNED TREATMENT REGIMEN: SBRT  PAST MEDICAL HISTORY:  has a past medical history of Anxiety, HTN (hypertension), Hyperlipidemia, and Subarachnoid hemorrhage (HCC) (12/30/2022).    PAST SURGICAL HISTORY: History reviewed. No pertinent surgical history.  FAMILY HISTORY: family history includes Breast cancer in her sister and sister; Diabetes in her father; Heart disease in her father and mother.  SOCIAL HISTORY:  reports that she quit smoking about 41 years ago. Her smoking use included cigarettes. She started  smoking about 65 years ago. She has a 24 pack-year smoking history. She has never used smokeless tobacco. She reports current drug use. Drug: Oxycodone . She reports that she does not drink alcohol.  ALLERGIES: Rsv mrna pre-f virus vaccine  MEDICATIONS:  Current Outpatient Medications  Medication Sig Dispense Refill   alendronate (FOSAMAX) 70 MG tablet Take 70 mg by mouth once a week.     amLODipine (NORVASC) 5 MG tablet Take 1 tablet by mouth daily.     aspirin  EC 81 MG tablet Take 81 mg by mouth daily.     diclofenac Sodium (VOLTAREN) 1 % GEL Apply 2 g topically 4 (four) times daily.     enalapril (VASOTEC) 20 MG tablet Take 0.5 tablets by mouth daily.     lidocaine  (LIDODERM ) 5 % Place 1 patch onto the skin every 12 (twelve) hours. Remove & Discard patch within 12 hours or as directed by MD 10 patch 0   Multiple Vitamins-Minerals (PRESERVISION AREDS) CAPS Take 1 capsule by mouth daily.     Nutritional Supplements (SALMON OIL PO) Take 1 capsule by mouth daily.     Omega-3 1000 MG CAPS Take 1 capsule by mouth daily.     pregabalin  (LYRICA ) 25 MG capsule Take 25 mg by mouth 2 (two) times daily.     sertraline  (ZOLOFT ) 50 MG tablet Take 1 tablet by mouth daily.     simvastatin  (ZOCOR ) 20 MG tablet Take 1 tablet by mouth at bedtime.     oxyCODONE  (ROXICODONE ) 5 MG immediate release tablet Take 1 tablet (5 mg total)  by mouth every 8 (eight) hours as needed. (Patient not taking: Reported on 02/01/2023) 20 tablet 0   No current facility-administered medications for this encounter.    ECOG PERFORMANCE STATUS:  0 - Asymptomatic  REVIEW OF SYSTEMS: Patient denies any weight loss, fatigue, weakness, fever, chills or night sweats. Patient denies any loss of vision, blurred vision. Patient denies any ringing  of the ears or hearing loss. No irregular heartbeat. Patient denies heart murmur or history of fainting. Patient denies any chest pain or pain radiating to her upper extremities. Patient denies  any shortness of breath, difficulty breathing at night, cough or hemoptysis. Patient denies any swelling in the lower legs. Patient denies any nausea vomiting, vomiting of blood, or coffee ground material in the vomitus. Patient denies any stomach pain. Patient states has had normal bowel movements no significant constipation or diarrhea. Patient denies any dysuria, hematuria or significant nocturia. Patient denies any problems walking, swelling in the joints or loss of balance. Patient denies any skin changes, loss of hair or loss of weight. Patient denies any excessive worrying or anxiety or significant depression. Patient denies any problems with insomnia. Patient denies excessive thirst, polyuria, polydipsia. Patient denies any swollen glands, patient denies easy bruising or easy bleeding. Patient denies any recent infections, allergies or URI. Patient s visual fields have not changed significantly in recent time. PHYSICAL EXAM: BP 136/85   Pulse 62   Temp (!) 97 F (36.1 C)   Resp 12   Wt 101 lb (45.8 kg)   BMI 19.73 kg/m  Thin female in NAD.  Does have some facial bruising from her recent fall.  Well-developed well-nourished patient in NAD. HEENT reveals PERLA, EOMI, discs not visualized.  Oral cavity is clear. No oral mucosal lesions are identified. Neck is clear without evidence of cervical or supraclavicular adenopathy. Lungs are clear to A&P. Cardiac examination is essentially unremarkable with regular rate and rhythm without murmur rub or thrill. Abdomen is benign with no organomegaly or masses noted. Motor sensory and DTR levels are equal and symmetric in the upper and lower extremities. Cranial nerves II through XII are grossly intact. Proprioception is intact. No peripheral adenopathy or edema is identified. No motor or sensory levels are noted. Crude visual fields are within normal range.  LABORATORY DATA: Labs reviewed    RADIOLOGY RESULTS: CT scans and PET CT scans reviewed  compatible with above-stated findings   IMPRESSION: Stage I non-small cell lung cancer of the lower portion of the right middle lobe in 83 year old female  PLAN: At this time I have offered SBRT treatment to this lesion.  I would plan on delivering 54 Gray in 3 fractions.  Would use motion restriction and 3-dimensional treatment planning.  Risks and benefits of treatment including possible fatigue possible development slight cough all were discussed in detail with the patient.  She seems to comprehend the recommendations well has consented to treatment.  I have personally set up and ordered CT simulation.  I would like to take this opportunity to thank you for allowing me to participate in the care of your patient.SABRA Marcey Penton, MD

## 2023-02-02 ENCOUNTER — Inpatient Hospital Stay: Payer: Medicare PPO | Attending: Oncology | Admitting: Hospice and Palliative Medicine

## 2023-02-02 DIAGNOSIS — E041 Nontoxic single thyroid nodule: Secondary | ICD-10-CM

## 2023-02-02 DIAGNOSIS — R911 Solitary pulmonary nodule: Secondary | ICD-10-CM

## 2023-02-02 NOTE — Progress Notes (Signed)
 Multidisciplinary Oncology Council Documentation  Krystal [name] was presented by our [facility] on 02/02/2023, which included representatives from:  Palliative Care Dietitian  Physical/Occupational Therapist Nurse Navigator Genetics Social work Survivorship RN Financial Navigator Research RN   Krystal [name] currently presents with history of lung mass  We reviewed previous medical and familial history, history of present illness, and recent lab results along with all available histopathologic and imaging studies. The MOC considered available treatment options and made the following recommendations/referrals:  SW  The MOC is a meeting of clinicians from various specialty areas who evaluate and discuss patients for whom a multidisciplinary approach is being considered. Final determinations in the plan of care are those of the provider(s).   Today's extended care, comprehensive team conference, Krystal [name] was not present for the discussion and was not examined.

## 2023-02-04 ENCOUNTER — Inpatient Hospital Stay: Payer: Medicare PPO

## 2023-02-04 NOTE — Progress Notes (Signed)
[facility] Clinical Social Work  Initial Assessment   Krystal [name] is a 83 y.o. year old female contacted by phone. Clinical Social Work was referred by  [facility]  for assessment of psychosocial needs.   SDOH (Social Determinants of Health) assessments performed: Yes   SDOH Screenings   Food Insecurity: No Food Insecurity (01/10/2023)  Housing: Unknown (01/10/2023)  Transportation Needs: No Transportation Needs (01/10/2023)  Utilities: Not At Risk (01/10/2023)  Depression (PHQ2-9): Low Risk  (01/10/2023)  Financial Resource Strain: Low Risk  (12/27/2022)   Received from [facility]  Tobacco Use: Medium Risk (02/01/2023)  Health Literacy: Medium Risk (01/29/2021)   Received from [facility]     Distress Screen completed: No    01/10/2023   10:40 AM  ONCBCN DISTRESS SCREENING  Screening Type Initial Screening  Distress experienced in past week (1-10) 0      Family/Social Information:  Housing Arrangement: patient lives alone.  Two of her son-in-laws live close by and assist her. Family members/support persons in your life? 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  Income source: Actor concerns: No Type of concern: None Food access concerns: no Religious or spiritual practice: No Services Currently in place:  Medicare  Coping/ Adjustment to diagnosis: Patient understands treatment plan and what happens next? yes Concerns about diagnosis and/or treatment:  Patient denied. Patient reported stressors:  Patient denied. Hopes and/or priorities: Family Patient enjoys sewing/ knitting Current coping skills/ strengths: Capable of independent living , Manufacturing systems engineer , Contractor , General fund of knowledge , and Supportive family/friends     SUMMARY: Current SDOH Barriers:  Patient denied.  Clinical Social Work Clinical Goal(s):  No clinical social work goals at this  time  Interventions: Discussed common feeling and emotions when being diagnosed with cancer, and the importance of support during treatment Informed patient of the support team roles and support services at [facility] Provided CSW contact information and encouraged patient to call with any questions or concerns Provided patient with information about the [employer].  Mailed her literature.   Follow Up Plan: CSW will follow-up with patient by phone  Patient verbalizes understanding of plan: Yes    Dorothey Baseman, LCSW Clinical Social Worker [facility]

## 2023-02-08 ENCOUNTER — Other Ambulatory Visit: Payer: Self-pay | Admitting: Radiation Oncology

## 2023-02-08 DIAGNOSIS — C801 Malignant (primary) neoplasm, unspecified: Secondary | ICD-10-CM

## 2023-02-10 ENCOUNTER — Ambulatory Visit
Admission: RE | Admit: 2023-02-10 | Discharge: 2023-02-10 | Disposition: A | Discharge status: Home / Self Care | Payer: Medicare PPO | Source: Ambulatory Visit | Attending: Radiation Oncology | Admitting: Radiation Oncology

## 2023-02-10 ENCOUNTER — Encounter: Payer: Self-pay | Admitting: *Deleted

## 2023-02-10 DIAGNOSIS — C3481 Malignant neoplasm of overlapping sites of right bronchus and lung: Secondary | ICD-10-CM | POA: Diagnosis present

## 2023-02-10 DIAGNOSIS — C801 Malignant (primary) neoplasm, unspecified: Secondary | ICD-10-CM

## 2023-02-14 ENCOUNTER — Telehealth: Payer: Self-pay | Admitting: *Deleted

## 2023-02-14 DIAGNOSIS — R911 Solitary pulmonary nodule: Secondary | ICD-10-CM

## 2023-02-14 NOTE — Telephone Encounter (Signed)
-----   Message from Creig Hines sent at 02/10/2023 10:26 AM EST ----- Regarding: RE: Follow up Ct chest without contrast 2 months port radiation and see me 2 weeks post scans ----- Message ----- From: Glory Buff, RN Sent: 02/10/2023   9:18 AM EST To: Creig Hines, MD Subject: Follow up                                      Pt will finish radiation on 2/17. Do you want to schedule any follow up with her when she finishes?  Mayme Genta

## 2023-02-14 NOTE — Telephone Encounter (Signed)
Orders placed. Message sent to scheduling to schedule and notify pt with appt.

## 2023-02-16 DIAGNOSIS — C3481 Malignant neoplasm of overlapping sites of right bronchus and lung: Secondary | ICD-10-CM | POA: Diagnosis not present

## 2023-02-21 ENCOUNTER — Other Ambulatory Visit: Payer: Self-pay

## 2023-02-21 ENCOUNTER — Ambulatory Visit
Admission: RE | Admit: 2023-02-21 | Discharge: 2023-02-21 | Disposition: A | Discharge status: Home / Self Care | Payer: Medicare PPO | Source: Ambulatory Visit | Attending: Radiation Oncology | Admitting: Radiation Oncology

## 2023-02-21 DIAGNOSIS — C3481 Malignant neoplasm of overlapping sites of right bronchus and lung: Secondary | ICD-10-CM | POA: Insufficient documentation

## 2023-02-21 LAB — RAD ONC ARIA SESSION SUMMARY
Course Elapsed Days: 0
Plan Fractions Treated to Date: 1
Plan Prescribed Dose Per Fraction: 12 Gy
Plan Total Fractions Prescribed: 5
Plan Total Prescribed Dose: 60 Gy
Reference Point Dosage Given to Date: 12 Gy
Reference Point Session Dosage Given: 12 Gy
Session Number: 1

## 2023-02-23 ENCOUNTER — Ambulatory Visit
Admission: RE | Admit: 2023-02-23 | Discharge: 2023-02-23 | Disposition: A | Discharge status: Home / Self Care | Payer: Medicare PPO | Source: Ambulatory Visit | Attending: Radiation Oncology | Admitting: Radiation Oncology

## 2023-02-23 ENCOUNTER — Encounter: Payer: Self-pay | Admitting: *Deleted

## 2023-02-23 ENCOUNTER — Other Ambulatory Visit: Payer: Self-pay

## 2023-02-23 DIAGNOSIS — C3481 Malignant neoplasm of overlapping sites of right bronchus and lung: Secondary | ICD-10-CM | POA: Diagnosis not present

## 2023-02-23 LAB — RAD ONC ARIA SESSION SUMMARY
Course Elapsed Days: 2
Plan Fractions Treated to Date: 2
Plan Prescribed Dose Per Fraction: 12 Gy
Plan Total Fractions Prescribed: 5
Plan Total Prescribed Dose: 60 Gy
Reference Point Dosage Given to Date: 24 Gy
Reference Point Session Dosage Given: 12 Gy
Session Number: 2

## 2023-02-28 ENCOUNTER — Other Ambulatory Visit: Payer: Self-pay

## 2023-02-28 ENCOUNTER — Ambulatory Visit
Admission: RE | Admit: 2023-02-28 | Discharge: 2023-02-28 | Disposition: A | Discharge status: Home / Self Care | Payer: Medicare PPO | Source: Ambulatory Visit | Attending: Radiation Oncology | Admitting: Radiation Oncology

## 2023-02-28 DIAGNOSIS — C3481 Malignant neoplasm of overlapping sites of right bronchus and lung: Secondary | ICD-10-CM | POA: Diagnosis not present

## 2023-02-28 LAB — RAD ONC ARIA SESSION SUMMARY
Course Elapsed Days: 7
Plan Fractions Treated to Date: 3
Plan Prescribed Dose Per Fraction: 12 Gy
Plan Total Fractions Prescribed: 5
Plan Total Prescribed Dose: 60 Gy
Reference Point Dosage Given to Date: 36 Gy
Reference Point Session Dosage Given: 12 Gy
Session Number: 3

## 2023-03-02 ENCOUNTER — Other Ambulatory Visit: Payer: Self-pay

## 2023-03-02 ENCOUNTER — Ambulatory Visit
Admission: RE | Admit: 2023-03-02 | Discharge: 2023-03-02 | Disposition: A | Discharge status: Home / Self Care | Payer: Medicare PPO | Source: Ambulatory Visit | Attending: Radiation Oncology | Admitting: Radiation Oncology

## 2023-03-02 DIAGNOSIS — C3481 Malignant neoplasm of overlapping sites of right bronchus and lung: Secondary | ICD-10-CM | POA: Diagnosis not present

## 2023-03-02 LAB — RAD ONC ARIA SESSION SUMMARY
Course Elapsed Days: 9
Plan Fractions Treated to Date: 4
Plan Prescribed Dose Per Fraction: 12 Gy
Plan Total Fractions Prescribed: 5
Plan Total Prescribed Dose: 60 Gy
Reference Point Dosage Given to Date: 48 Gy
Reference Point Session Dosage Given: 12 Gy
Session Number: 4

## 2023-03-07 ENCOUNTER — Other Ambulatory Visit: Payer: Self-pay

## 2023-03-07 ENCOUNTER — Ambulatory Visit
Admission: RE | Admit: 2023-03-07 | Discharge: 2023-03-07 | Disposition: A | Discharge status: Home / Self Care | Payer: Medicare PPO | Source: Ambulatory Visit | Attending: Radiation Oncology | Admitting: Radiation Oncology

## 2023-03-07 DIAGNOSIS — C3481 Malignant neoplasm of overlapping sites of right bronchus and lung: Secondary | ICD-10-CM | POA: Diagnosis not present

## 2023-03-07 LAB — RAD ONC ARIA SESSION SUMMARY
Course Elapsed Days: 14
Plan Fractions Treated to Date: 5
Plan Prescribed Dose Per Fraction: 12 Gy
Plan Total Fractions Prescribed: 5
Plan Total Prescribed Dose: 60 Gy
Reference Point Dosage Given to Date: 60 Gy
Reference Point Session Dosage Given: 12 Gy
Session Number: 5

## 2023-03-08 NOTE — Radiation Completion Notes (Signed)
 Patient Name: Krystal [name], BOURQUIN MRN: [id number] Date of Birth: 04-25-1940 Referring Physician: Owens Shark, M.D. Date of Service: 2023-03-08 Radiation Oncologist: Carmina Miller, M.D. [facility] -                              RADIATION ONCOLOGY END OF TREATMENT NOTE     Diagnosis: C34.31 Malignant neoplasm of lower lobe, right bronchus or lung Intent: Curative     HPI: Patient is a 83 year old female nonsurgical candidate who has been followed by a right middle lobe nodule that was discovered incidentally at the time of a fall at home in early December which included a CT scan of her chest.  At that time there was 1.8 x 1.5 cm spiculated nodule in the inferior right middle lobe.  No evidence of hilar or mediastinal adenopathy was noted.  She did based on the fall have displaced fractures of the posterior right 8th through 10th ribs.  PET CT scan was performed showing hypermetabolic activity consistent with primary bronchogenic carcinoma.  Based on her age and difficulty in biopsy not thought to be a surgical candidate case was presented at tumor board with recommendation for SBRT treatment.  She is seen today and is doing fairly well.  Specifically Nuys cough hemoptysis chest tightness.      ==========DELIVERED PLANS==========  First Treatment Date: 2023-02-21 Last Treatment Date: 2023-03-07   Plan Name: Lung_R_SBRT Site: Lung, Right Technique: SBRT/SRT-IMRT Mode: Photon Dose Per Fraction: 12 Gy Prescribed Dose (Delivered / Prescribed): 60 Gy / 60 Gy Prescribed Fxs (Delivered / Prescribed): 5 / 5     ==========ON TREATMENT VISIT DATES========== 2023-02-21, 2023-02-23, 2023-02-28, 2023-02-28, 2023-03-02, 2023-03-07     ==========UPCOMING VISITS==========       ==========APPENDIX - ON TREATMENT VISIT NOTES==========   See weekly On Treatment Notes in Epic for details in the Media tab (listed as Progress notes on the On Treatment Visit Dates listed above).

## 2023-04-13 ENCOUNTER — Ambulatory Visit
Admission: RE | Admit: 2023-04-13 | Discharge: 2023-04-13 | Disposition: A | Discharge status: Home / Self Care | Payer: Medicare PPO | Source: Ambulatory Visit | Attending: Radiation Oncology | Admitting: Radiation Oncology

## 2023-04-13 VITALS — BP 118/63 | HR 78 | Resp 14 | Ht 60.0 in | Wt 101.0 lb

## 2023-04-13 DIAGNOSIS — I351 Nonrheumatic aortic (valve) insufficiency: Secondary | ICD-10-CM | POA: Insufficient documentation

## 2023-04-13 DIAGNOSIS — Z923 Personal history of irradiation: Secondary | ICD-10-CM | POA: Diagnosis not present

## 2023-04-13 DIAGNOSIS — C3431 Malignant neoplasm of lower lobe, right bronchus or lung: Secondary | ICD-10-CM | POA: Insufficient documentation

## 2023-04-13 DIAGNOSIS — R059 Cough, unspecified: Secondary | ICD-10-CM | POA: Diagnosis not present

## 2023-04-13 DIAGNOSIS — R1310 Dysphagia, unspecified: Secondary | ICD-10-CM | POA: Insufficient documentation

## 2023-04-13 DIAGNOSIS — R911 Solitary pulmonary nodule: Secondary | ICD-10-CM

## 2023-04-13 NOTE — Progress Notes (Signed)
 Radiation Oncology Follow up Note  Name: Krystal [name]   Date:   04/13/2023 MRN:  [id number] DOB: 04-30-1940    This 83 y.o. 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  Seen today in routine follow-up she is doing well.  Specifically Nuys cough any dysphagia fatigue or any change in her pulmonary status.  She is scheduled for.  Surgery on her aorta from I believe aortic insufficiency.  She is already scheduled for follow-up CT scans by Dr. Smith Robert.  COMPLICATIONS OF TREATMENT: none  FOLLOW UP COMPLIANCE: keeps appointments   PHYSICAL EXAM:  BP 118/63   Pulse 78   Resp 14   Ht 5' (1.524 m)   Wt 101 lb (45.8 kg)   BMI 19.73 kg/m  Well-developed well-nourished patient in NAD. HEENT reveals PERLA, EOMI, discs not visualized.  Oral cavity is clear. No oral mucosal lesions are identified. Neck is clear without evidence of cervical or supraclavicular adenopathy. Lungs are clear to A&P. Cardiac examination is essentially unremarkable with regular rate and rhythm without murmur rub or thrill. Abdomen is benign with no organomegaly or masses noted. Motor sensory and DTR levels are equal and symmetric in the upper and lower extremities. Cranial nerves II through XII are grossly intact. Proprioception is intact. No peripheral adenopathy or edema is identified. No motor or sensory levels are noted. Crude visual fields are within normal range.  RADIOLOGY RESULTS: No current films to review  PLAN: At the present time patient is doing well very low side effect profile from her SBRT.  She is scheduled for heart surgery and next month.  CT scans are being ordered by Dr. Smith Robert I will see her back in 3 to 4  months for follow-up.  Patient and family know to call with any concerns she continues to do well from our standpoint.  I would like to take this opportunity to thank you for allowing me to participate in the care of your patient.Carmina Miller, MD

## 2023-05-05 ENCOUNTER — Ambulatory Visit
Admission: RE | Admit: 2023-05-05 | Discharge: 2023-05-05 | Disposition: A | Discharge status: Home / Self Care | Payer: Medicare PPO | Source: Ambulatory Visit | Attending: Oncology | Admitting: Oncology

## 2023-05-05 DIAGNOSIS — R911 Solitary pulmonary nodule: Secondary | ICD-10-CM | POA: Insufficient documentation

## 2023-05-20 ENCOUNTER — Encounter: Payer: Self-pay | Admitting: Oncology

## 2023-05-20 ENCOUNTER — Inpatient Hospital Stay: Payer: Medicare PPO | Attending: Oncology | Admitting: Oncology

## 2023-05-20 DIAGNOSIS — R911 Solitary pulmonary nodule: Secondary | ICD-10-CM | POA: Diagnosis present

## 2023-05-20 NOTE — Progress Notes (Signed)
 Hematology/Oncology Consult note [facility]  Telephone:([phone] [phone]  Patient Care Team: Versie Gores, FNP as PCP - General (Family Medicine) Avonne Boettcher, MD as Consulting Physician (Oncology) Drake Gens, RN as Oncology Nurse Navigator Glenis Langdon, MD as Consulting Physician (Radiation Oncology)   Name of the patient: Krystal [name]  [id number]  30-Dec-1940   Date of visit: 05/20/23  Diagnosis-presumed stage I right middle lobe lung cancer  Chief complaint/ Reason for visit-routine follow-up of lung cancer  Heme/Onc history: Patient is a 83 year old female with a past medical history significant for hypertension hyperlipidemia and longstanding history of smoking who had a fall at home sometime in early December 2024. Following that patient was in a rehab and had a second fall at the rehab on 01/09/2023 and was brought to the ER. She underwent CT chest abdomen and pelvis with contrast at that time which incidentally showed a spiculated nodule in the inferior right middle lobe measuring 1.8 x 1.5 cm. No evidence of enlarged mediastinal hilar or axillary adenopathy or distant metastatic disease. Minimally displaced fractures of the posterolateral right 8th through 10th ribs.   Given her age and frailty as well as location of the lesion it was not amenable to biopsy.  Case was discussed at tumor board and plan was to proceed with empiric SBRT to the nodule which she received in January 2025.  Interval history-patient recently underwent aortic valve replacement surgery.  She reports having more energy since then.  Also tolerated SBRT well  ECOG PS- 2 Pain scale- 0   Review of systems- Review of Systems  Constitutional:  Negative for chills, fever, malaise/fatigue and weight loss.  HENT:  Negative for congestion, ear discharge and nosebleeds.   Eyes:  Negative for blurred vision.  Respiratory:  Negative for cough,  hemoptysis, sputum production, shortness of breath and wheezing.   Cardiovascular:  Negative for chest pain, palpitations, orthopnea and claudication.  Gastrointestinal:  Negative for abdominal pain, blood in stool, constipation, diarrhea, heartburn, melena, nausea and vomiting.  Genitourinary:  Negative for dysuria, flank pain, frequency, hematuria and urgency.  Musculoskeletal:  Negative for back pain, joint pain and myalgias.  Skin:  Negative for rash.  Neurological:  Negative for dizziness, tingling, focal weakness, seizures, weakness and headaches.  Endo/Heme/Allergies:  Does not bruise/bleed easily.  Psychiatric/Behavioral:  Negative for depression and suicidal ideas. The patient does not have insomnia.       Allergies  Allergen Reactions   Rsv Mrna Pre-F Virus Vaccine     Body aches and pain, nausea and weakness     Past Medical History:  Diagnosis Date   Anxiety    HTN (hypertension)    Hyperlipidemia    Subarachnoid hemorrhage (HCC) 12/30/2022     No past surgical history on file.  Social History   Socioeconomic History   Marital status: Divorced    Spouse name: Not on file   Number of children: Not on file   Years of education: Not on file   Highest education level: Not on file  Occupational History   Not on file  Tobacco Use   Smoking status: Former    Current packs/day: 0.00    Average packs/day: 1 pack/day for 24.0 years (24.0 ttl pk-yrs)    Types: Cigarettes    Start date: 93    Quit date: 48    Years since quitting: 41.3   Smokeless tobacco: Never  Vaping Use   Vaping status: Never Used  Substance and Sexual Activity   Alcohol use: Never   Drug use: Yes    Types: Oxycodone     Comment: for pain management due to fall   Sexual activity: Not on file  Other Topics Concern   Not on file  Social History Narrative   Not on file   Social Drivers of Health   Financial Resource Strain: Low Risk  (05/18/2023)   Received from [facility]    Overall Financial Resource Strain (CARDIA)    Difficulty of Paying Living Expenses: Not very hard  Food Insecurity: No Food Insecurity (05/18/2023)   Received from [facility]   Hunger Vital Sign    Worried About Running Out of Food in the Last Year: Never true    Ran Out of Food in the Last Year: Never true  Transportation Needs: No Transportation Needs (05/18/2023)   Received from [facility] - Transportation    Lack of Transportation (Medical): No    Lack of Transportation (Non-Medical): No  Physical Activity: Not on file  Stress: Not on file  Social Connections: Not on file  Intimate Partner Violence: Not At Risk (01/10/2023)   Humiliation, Afraid, Rape, and Kick questionnaire    Fear of Current or Ex-Partner: No    Emotionally Abused: No    Physically Abused: No    Sexually Abused: No    Family History  Problem Relation Age of Onset   Heart disease Mother    Diabetes Father    Heart disease Father    Breast cancer Sister    Breast cancer Sister      Current Outpatient Medications:    alendronate (FOSAMAX) 70 MG tablet, Take 70 mg by mouth once a week., Disp: , Rfl:    amLODipine (NORVASC) 5 MG tablet, Take 1 tablet by mouth daily., Disp: , Rfl:    aspirin EC 81 MG tablet, Take 81 mg by mouth daily., Disp: , Rfl:    diclofenac Sodium (VOLTAREN) 1 % GEL, Apply 2 g topically 4 (four) times daily., Disp: , Rfl:    enalapril (VASOTEC) 20 MG tablet, Take 0.5 tablets by mouth daily., Disp: , Rfl:    lidocaine  (LIDODERM ) 5 %, Place 1 patch onto the skin every 12 (twelve) hours. Remove & Discard patch within 12 hours or as directed by MD, Disp: 10 patch, Rfl: 0   Multiple Vitamins-Minerals (PRESERVISION AREDS) CAPS, Take 1 capsule by mouth daily., Disp: , Rfl:    Nutritional Supplements (SALMON OIL PO), Take 1 capsule by mouth daily., Disp: , Rfl:    Omega-3 1000 MG CAPS, Take 1 capsule by mouth daily., Disp: , Rfl:    oxyCODONE  (ROXICODONE ) 5 MG immediate  release tablet, Take 1 tablet (5 mg total) by mouth every 8 (eight) hours as needed. (Patient not taking: Reported on 02/01/2023), Disp: 20 tablet, Rfl: 0   pregabalin (LYRICA) 25 MG capsule, Take 25 mg by mouth 2 (two) times daily., Disp: , Rfl:    sertraline (ZOLOFT) 50 MG tablet, Take 1 tablet by mouth daily., Disp: , Rfl:    simvastatin (ZOCOR) 20 MG tablet, Take 1 tablet by mouth at bedtime., Disp: , Rfl:   Physical exam: There were no vitals filed for this visit. Physical Exam Constitutional:      Comments: Thin elderly frail woman in no acute distress  Cardiovascular:     Rate and Rhythm: Normal rate and regular rhythm.     Heart sounds: Normal heart sounds.  Pulmonary:     Effort: Pulmonary effort is normal.     Breath sounds: Normal breath sounds.  Abdominal:     General: Bowel sounds are normal.     Palpations: Abdomen is soft.  Skin:    General: Skin is warm and dry.  Neurological:     Mental Status: She is alert and oriented to person, place, and time.     I have personally reviewed labs listed below:    Latest Ref Rng & Units 01/09/2023    3:00 PM  CMP  Total Protein 6.5 - 8.1 g/dL 7.4   Total Bilirubin <1.6 mg/dL 0.6   Alkaline Phos 38 - 126 U/L 78   AST 15 - 41 U/L 21   ALT 0 - 44 U/L 19       Latest Ref Rng & Units 01/09/2023    2:56 PM  CBC  WBC 4.0 - 10.5 K/uL 7.7   Hemoglobin 12.0 - 15.0 g/dL 10.9   Hematocrit 60.4 - 46.0 % 34.8   Platelets 150 - 400 K/uL 243       Assessment and plan- Patient is a 83 y.o. female with history of right middle lobe lung nodule presumed lung cancer s/p SBRT hereFor a routine follow-up  I have reviewed CT chest images independently and discussed findings with the patient which shows decrease in the size of the lung nodule from a prior size of 18 mm presently to 5 mm.  She has small nonspecific left upper lobe nodules which are likely inflammatory.  I will plan to get a repeat CT chest without contrast in 4 months time.    Visit Diagnosis 1. Lung nodule      Dr. Seretha Dance, MD, MPH [facility] [phone] 05/20/2023 11:10 AM

## 2023-05-23 ENCOUNTER — Telehealth: Payer: Self-pay

## 2023-05-23 DIAGNOSIS — E041 Nontoxic single thyroid nodule: Secondary | ICD-10-CM

## 2023-05-23 NOTE — Telephone Encounter (Signed)
 Per Dr. Randy Buttery "Please let patients son know that the lung nodule area looks much improved after radiation. Please ask him if she is willing to consider thyroid usg for her thyroid nodule".  Outbound call to caregiver / son Goble Last; phone continued to ring, unable to leave a voice message.

## 2023-05-23 NOTE — Telephone Encounter (Signed)
-----   Message from Avonne Boettcher sent at 05/22/2023  9:52 AM EDT ----- Please let patients son know that the lung nodule area looks much improved after radiation. Please ask him if she is willing to consider thyroid usg for her thyroid nodule

## 2023-05-25 NOTE — Telephone Encounter (Signed)
 Outbound call to patient since unable to get in touch with caregiver / son Krystal [name]; spoke to patient who gave verbal authorization to speak to son's spouse Krystal [name].  Informed of above; patient is willing to do a thyroid usg to further investigate the thyroid nodule present; order placed in chart.  Since Krystal [name] has been difficult to get in touch with, Krystal [name] asked to contact her or Krystal [name] for scheduling.

## 2023-05-27 ENCOUNTER — Ambulatory Visit
Admission: RE | Admit: 2023-05-27 | Discharge: 2023-05-27 | Disposition: A | Discharge status: Home / Self Care | Source: Ambulatory Visit | Attending: Oncology | Admitting: Oncology

## 2023-05-27 DIAGNOSIS — E041 Nontoxic single thyroid nodule: Secondary | ICD-10-CM | POA: Diagnosis present

## 2023-06-01 ENCOUNTER — Encounter: Attending: Family Medicine

## 2023-06-01 DIAGNOSIS — Z952 Presence of prosthetic heart valve: Secondary | ICD-10-CM

## 2023-06-01 NOTE — Progress Notes (Signed)
 Patient states she has a lot going on and is not interested at this time.

## 2023-06-08 ENCOUNTER — Ambulatory Visit

## 2023-08-05 NOTE — Discharge Summary (Signed)
 ------------------------------------------------------------------------------- Attestation signed by Gareth, Leita Heckler, MD at 08/05/23 1259 I saw and evaluated the patient, participating in the key portions of the service on the day of discharge.  I reviewed the resident's note and agree with the discharge plans and disposition.  I personally spent over 30 minutes in discharge planning services. Leita MARLA Gareth, MD  -------------------------------------------------------------------------------   Physician Discharge Summary HBR 1 BT2 [facility] [address] [place] [address] Dept: [phone] Loc: [phone]   Identifying Information:  Krystal [name] Oct 31, 1940 [id number]  Primary Care Physician: Dreama Alan Kaufmann, FNP  Code Status: Full Code  Admit Date: 08/02/2023  Discharge Date: 08/05/2023   Discharge To: Skilled nursing facility  Discharge Service: HBR - FAM - Blue   Discharge Attending Physician: Leita Heckler Gareth, MD  Discharge Diagnoses: Principal Problem:   Acute encephalopathy Active Problems:   Stage 3 chronic kidney disease (CMS-HCC)   UTI (urinary tract infection)   Hypernatremia   Hyperkalemia   Multiple falls   Outpatient Provider Follow Up Issues:  [ ]  Cognitive assessment   Hospital Course:  Krystal [name] is a 83 y.o. female whose presentation is complicated by aortic stenosis s/p TAVR, HTN, stage 3 CKD, osteoporosis, and baseline mild cognitive impairment who presented to [facility] Emergency Department with acute encephalopathy.   Some initial concern for UTI; however, she was found to be retaining ~700 ml of urine due to a rectal stool ball. Her urine culture was ultimately negative. She was manually disimpacted and fluid resuscitated. Her mental status improved to baseline with continued bowel regimen and improving PO intake.   Suspect some stretch injury to the bladder from initial distention. Discharging with a foley  catheter. Recommend trial void in 1 week with referral to urology.     Procedures: Manual disimpaction No admission procedures for hospital encounter. ______________________________________________________________________ Discharge Medications:   Your Medication List     ASK your doctor about these medications    acetaminophen  500 MG tablet Commonly known as: TYLENOL  Take 1 tablet (500 mg total) by mouth every six (6) hours as needed for pain.   alendronate 70 MG tablet Commonly known as: FOSAMAX Take 1 tablet (70 mg total) by mouth once a week.   amlodipine 5 MG tablet Commonly known as: NORVASC Take 1 tablet (5 mg total) by mouth daily.   aspirin  81 MG tablet Commonly known as: ECOTRIN Take 1 tablet (81 mg total) by mouth daily.   enalapril 20 MG tablet Commonly known as: VASOTEC TAKE 1/2 (ONE-HALF) TABLET BY MOUTH ONCE DAILY IN THE MORNING   pregabalin  50 MG capsule Commonly known as: LYRICA  Take 1 capsule (50 mg total) by mouth two (2) times a day.   sertraline  50 MG tablet Commonly known as: ZOLOFT  Take 1 tablet (50 mg total) by mouth daily.   simvastatin  20 MG tablet Commonly known as: ZOCOR  Take 1 tablet by mouth nightly   VOLTAREN ARTHRITIS PAIN 1 % gel Generic drug: diclofenac sodium Apply 2 g topically four (4) times a day as needed for pain.        Allergies: Rsv vac, pref a and pref b(pf) ______________________________________________________________________ Pending Test Results (if blank, then none):   Most Recent Labs: All lab results last 24 hours -  Recent Results (from the past 24 hours)  Basic Metabolic Panel   Collection Time: 08/05/23  6:50 AM  Result Value Ref Range   Sodium 145 135 - 145 mmol/L   Potassium 4.5 3.5 - 5.1 mmol/L  Chloride 105 98 - 107 mmol/L   CO2 23.4 20.0 - 31.0 mmol/L   Anion Gap 17 (H) 5 - 14 mmol/L   BUN 24 (H) 9 - 23 mg/dL   Creatinine 9.15 9.44 - 1.02 mg/dL   BUN/Creatinine Ratio 29    eGFR CKD-EPI  (2021) Female 68 >=60 mL/min/1.33m2   Glucose 83 70 - 179 mg/dL   Calcium 9.0 8.7 - 89.5 mg/dL  CBC w/ Differential   Collection Time: 08/05/23  6:50 AM  Result Value Ref Range   WBC 6.4 3.6 - 11.2 10*9/L   RBC 3.60 (L) 3.95 - 5.13 10*12/L   HGB 11.3 11.3 - 14.9 g/dL   HCT 67.0 (L) 65.9 - 55.9 %   MCV 91.3 77.6 - 95.7 fL   MCH 31.2 25.9 - 32.4 pg   MCHC 34.2 32.0 - 36.0 g/dL   RDW 86.1 87.7 - 84.7 %   MPV 8.1 6.8 - 10.7 fL   Platelet 181 150 - 450 10*9/L   nRBC 0 <=4 /100 WBCs   Neutrophils % 70.2 %   Lymphocytes % 18.8 %   Monocytes % 4.5 %   Eosinophils % 5.9 %   Basophils % 0.6 %   Absolute Neutrophils 4.5 1.8 - 7.8 10*9/L   Absolute Lymphocytes 1.2 1.1 - 3.6 10*9/L   Absolute Monocytes 0.3 0.3 - 0.8 10*9/L   Absolute Eosinophils 0.4 0.0 - 0.5 10*9/L   Absolute Basophils 0.0 0.0 - 0.1 10*9/L   Microbiology -  Microbiology Results (last day)     ** No results found for the last 24 hours. **       Relevant Studies/Radiology (if blank, then none):   ______________________________________________________________________ Discharge Instructions:  [ ]  Trial void in 1 week [ ]  Referral to urology      Resources and Referrals     Commode chair     Type of commode: Chair Comment - 3-in-1   Height:   152.4 cm  Weight:  46.2 kg      Height:   152.4 cm  Weight:  46.2 kg   Home Medical Equipment     Length of Need: 99   Specify: Hoyer Lift   Height:   152.4 cm  Weight:  46.2 kg      Height:   152.4 cm  Weight:  46.2 kg        Follow Up instructions and Outpatient Referrals    Ambulatory Referral to Home Health     Reason for referral: Nursing Care, SW services.   Physician to follow patient's care: PCP   Disciplines requested:  Nursing Home Health Aide Medical Social Work     Nursing requested: Teaching/skilled observation and assessment   What teaching is needed (new diagnosis? new medications?): Medication  Management, VS, Disease process  management.   Medical Social Work requested: General area of patient need   General area of patient need: Please assist with finding caregiver  support.   Is this a [employer] or [facility] Patient?: Yes   Do you want agency provider parameter notifications or patient specific  provider parameter notifications?: Agency   Do you want to initiate remote patient monitoring?: No     Appointments which have been scheduled for you    Aug 10, 2023 1:20 PM (Arrive by 1:05 PM) RETURN CONTINUITY with Alan Marry Nose, FNP [facility] [address] [place] [address] [place] [address] [place]  [address] [phone]     Sep 02, 2023 11:20 AM (Arrive by 11:05 AM) RETURN GENERAL with Kristopher Lamar Sport, MD [facility] PHYSICAL MEDICINE [facility] [address] [place] [facility] REG[place]) [phone] [address] [place] [address] [phone]     Oct 11, 2023 10:40 AM (Arrive by 10:25 AM) RETURN CARDIOLOGY with Velma Glendia Bevels, MD [facility] [address] [facility] REGION) [address] Floor [place] [address] [phone]     Nov 17, 2023 10:00 AM (Arrive by 9:45 AM) RETURN GENERAL with Larissa Dennise Seen, MD [facility] REGION) [address] [place] [address] [place] [address] [phone]     Jan 31, 2024 12:00 PM (Arrive by 11:45 AM) RETURN ENDOCRINE with Arnulfo Rosaline Kells, MD [facility] [place] [facility] REGION) [address] [place] [address] [place] [address] [phone]        ______________________________________________________________________ Discharge Day Services: BP 109/43   Pulse 67   Temp 36.5 C (97.7 F) (Oral)   Resp 18   Ht 152.4 cm (5')   Wt 45.6 kg (100 lb 9.6 oz)   SpO2 100%   BMI 19.65 kg/m  Pt seen on the day of discharge and  determined appropriate for discharge.  GEN: well appearing, lying in bed, NAD  HEENT: NCAT, MMM. EOMI. Neck: Supple. CV: Regular rate and rhythm. No murmurs/rubs/gallops. Pulm: CTAB. No wheezing, crackles, or rhonchi. Abd: Flat.  Nontender. No guarding, rebound.  Normoactive bowel sounds.   Neuro: A&O x 3. No focal deficits.  Ext: No peripheral edema.  Palpable distal pulses.  Condition at Discharge: good  Length of Discharge: I spent greater than 30 mins in the discharge of this patient.

## 2023-08-08 ENCOUNTER — Ambulatory Visit: Admitting: Radiation Oncology

## 2023-08-15 ENCOUNTER — Emergency Department

## 2023-08-15 ENCOUNTER — Inpatient Hospital Stay
Admission: EM | Admit: 2023-08-15 | Discharge: 2023-08-19 | DRG: 699 | Disposition: A | Discharge status: Skilled Nursing Facility | Source: Skilled Nursing Facility | Attending: Internal Medicine | Admitting: Internal Medicine

## 2023-08-15 ENCOUNTER — Other Ambulatory Visit: Payer: Self-pay

## 2023-08-15 DIAGNOSIS — N390 Urinary tract infection, site not specified: Secondary | ICD-10-CM | POA: Diagnosis not present

## 2023-08-15 DIAGNOSIS — Z66 Do not resuscitate: Secondary | ICD-10-CM | POA: Diagnosis present

## 2023-08-15 DIAGNOSIS — Z952 Presence of prosthetic heart valve: Secondary | ICD-10-CM

## 2023-08-15 DIAGNOSIS — G629 Polyneuropathy, unspecified: Secondary | ICD-10-CM | POA: Diagnosis present

## 2023-08-15 DIAGNOSIS — R339 Retention of urine, unspecified: Secondary | ICD-10-CM | POA: Diagnosis present

## 2023-08-15 DIAGNOSIS — I959 Hypotension, unspecified: Secondary | ICD-10-CM | POA: Diagnosis present

## 2023-08-15 DIAGNOSIS — Z803 Family history of malignant neoplasm of breast: Secondary | ICD-10-CM | POA: Diagnosis not present

## 2023-08-15 DIAGNOSIS — E872 Acidosis, unspecified: Secondary | ICD-10-CM | POA: Diagnosis present

## 2023-08-15 DIAGNOSIS — Z87891 Personal history of nicotine dependence: Secondary | ICD-10-CM | POA: Diagnosis not present

## 2023-08-15 DIAGNOSIS — Z7983 Long term (current) use of bisphosphonates: Secondary | ICD-10-CM | POA: Diagnosis not present

## 2023-08-15 DIAGNOSIS — T83511A Infection and inflammatory reaction due to indwelling urethral catheter, initial encounter: Principal | ICD-10-CM | POA: Diagnosis present

## 2023-08-15 DIAGNOSIS — E785 Hyperlipidemia, unspecified: Secondary | ICD-10-CM | POA: Diagnosis present

## 2023-08-15 DIAGNOSIS — Z7982 Long term (current) use of aspirin: Secondary | ICD-10-CM

## 2023-08-15 DIAGNOSIS — N289 Disorder of kidney and ureter, unspecified: Secondary | ICD-10-CM

## 2023-08-15 DIAGNOSIS — Z978 Presence of other specified devices: Secondary | ICD-10-CM

## 2023-08-15 DIAGNOSIS — M810 Age-related osteoporosis without current pathological fracture: Secondary | ICD-10-CM | POA: Diagnosis present

## 2023-08-15 DIAGNOSIS — I129 Hypertensive chronic kidney disease with stage 1 through stage 4 chronic kidney disease, or unspecified chronic kidney disease: Secondary | ICD-10-CM | POA: Diagnosis present

## 2023-08-15 DIAGNOSIS — Z833 Family history of diabetes mellitus: Secondary | ICD-10-CM | POA: Diagnosis not present

## 2023-08-15 DIAGNOSIS — Z79899 Other long term (current) drug therapy: Secondary | ICD-10-CM

## 2023-08-15 DIAGNOSIS — Z8249 Family history of ischemic heart disease and other diseases of the circulatory system: Secondary | ICD-10-CM

## 2023-08-15 DIAGNOSIS — N1832 Chronic kidney disease, stage 3b: Secondary | ICD-10-CM | POA: Diagnosis present

## 2023-08-15 DIAGNOSIS — F0390 Unspecified dementia without behavioral disturbance: Secondary | ICD-10-CM | POA: Diagnosis present

## 2023-08-15 DIAGNOSIS — Y846 Urinary catheterization as the cause of abnormal reaction of the patient, or of later complication, without mention of misadventure at the time of the procedure: Secondary | ICD-10-CM | POA: Diagnosis present

## 2023-08-15 LAB — CBC WITH DIFFERENTIAL/PLATELET
Abs Immature Granulocytes: 0.03 K/uL (ref 0.00–0.07)
Basophils Absolute: 0 K/uL (ref 0.0–0.1)
Basophils Relative: 0 %
Eosinophils Absolute: 0.3 K/uL (ref 0.0–0.5)
Eosinophils Relative: 4 %
HCT: 30.4 % — ABNORMAL LOW (ref 36.0–46.0)
Hemoglobin: 9.7 g/dL — ABNORMAL LOW (ref 12.0–15.0)
Immature Granulocytes: 0 %
Lymphocytes Relative: 18 %
Lymphs Abs: 1.4 K/uL (ref 0.7–4.0)
MCH: 30.5 pg (ref 26.0–34.0)
MCHC: 31.9 g/dL (ref 30.0–36.0)
MCV: 95.6 fL (ref 80.0–100.0)
Monocytes Absolute: 0.4 K/uL (ref 0.1–1.0)
Monocytes Relative: 5 %
Neutro Abs: 5.5 K/uL (ref 1.7–7.7)
Neutrophils Relative %: 73 %
Platelets: 157 K/uL (ref 150–400)
RBC: 3.18 MIL/uL — ABNORMAL LOW (ref 3.87–5.11)
RDW: 13.5 % (ref 11.5–15.5)
WBC: 7.6 K/uL (ref 4.0–10.5)
nRBC: 0 % (ref 0.0–0.2)

## 2023-08-15 LAB — COMPREHENSIVE METABOLIC PANEL WITH GFR
ALT: 11 U/L (ref 0–44)
AST: 15 U/L (ref 15–41)
Albumin: 2.6 g/dL — ABNORMAL LOW (ref 3.5–5.0)
Alkaline Phosphatase: 83 U/L (ref 38–126)
Anion gap: 8 (ref 5–15)
BUN: 43 mg/dL — ABNORMAL HIGH (ref 8–23)
CO2: 21 mmol/L — ABNORMAL LOW (ref 22–32)
Calcium: 8.2 mg/dL — ABNORMAL LOW (ref 8.9–10.3)
Chloride: 112 mmol/L — ABNORMAL HIGH (ref 98–111)
Creatinine, Ser: 1.42 mg/dL — ABNORMAL HIGH (ref 0.44–1.00)
GFR, Estimated: 36 mL/min — ABNORMAL LOW (ref >60)
Glucose, Bld: 85 mg/dL (ref 70–99)
Potassium: 4 mmol/L (ref 3.5–5.1)
Sodium: 141 mmol/L (ref 135–145)
Total Bilirubin: 0.3 mg/dL (ref 0.0–1.2)
Total Protein: 5.2 g/dL — ABNORMAL LOW (ref 6.5–8.1)

## 2023-08-15 LAB — URINALYSIS, ROUTINE W REFLEX MICROSCOPIC
Bilirubin Urine: NEGATIVE
Glucose, UA: NEGATIVE mg/dL
Hgb urine dipstick: NEGATIVE
Ketones, ur: NEGATIVE mg/dL
Nitrite: NEGATIVE
Protein, ur: NEGATIVE mg/dL
Specific Gravity, Urine: 1.017 (ref 1.005–1.030)
WBC, UA: >50 WBC/hpf (ref 0–5)
pH: 5 (ref 5.0–8.0)

## 2023-08-15 LAB — PROCALCITONIN: Procalcitonin: <0.1 ng/mL

## 2023-08-15 LAB — LACTIC ACID, PLASMA: Lactic Acid, Venous: 0.7 mmol/L (ref 0.5–1.9)

## 2023-08-15 LAB — CBG MONITORING, ED: Glucose-Capillary: 75 mg/dL (ref 70–99)

## 2023-08-15 LAB — BRAIN NATRIURETIC PEPTIDE: B Natriuretic Peptide: 37.1 pg/mL (ref 0.0–100.0)

## 2023-08-15 MED ORDER — SODIUM CHLORIDE 0.9% FLUSH
3.0000 mL | Freq: Two times a day (BID) | INTRAVENOUS | Status: DC
  Administered 2023-08-15 – 2023-08-19 (×8): 3 mL via INTRAVENOUS

## 2023-08-15 MED ORDER — ONDANSETRON HCL 4 MG PO TABS: 4.0000 mg | ORAL_TABLET | Freq: Four times a day (QID) | ORAL | Status: DC | PRN

## 2023-08-15 MED ORDER — ORAL CARE MOUTH RINSE: 15.0000 mL | OROMUCOSAL | Status: DC | PRN

## 2023-08-15 MED ORDER — SIMVASTATIN 20 MG PO TABS
20.0000 mg | ORAL_TABLET | Freq: Every day | ORAL | Status: DC
  Administered 2023-08-15 – 2023-08-18 (×4): 20 mg via ORAL
  Filled 2023-08-15 (×4): qty 1

## 2023-08-15 MED ORDER — SODIUM CHLORIDE 0.9 % IV SOLN: INTRAVENOUS | Status: DC

## 2023-08-15 MED ORDER — SERTRALINE HCL 50 MG PO TABS
50.0000 mg | ORAL_TABLET | Freq: Every day | ORAL | Status: DC
Start: 2023-08-15 — End: 2023-08-19
  Administered 2023-08-15 – 2023-08-19 (×5): 50 mg via ORAL
  Filled 2023-08-15 (×5): qty 1

## 2023-08-15 MED ORDER — BISACODYL 5 MG PO TBEC
5.0000 mg | DELAYED_RELEASE_TABLET | Freq: Every day | ORAL | Status: DC | PRN
  Administered 2023-08-19: 5 mg via ORAL
  Filled 2023-08-15: qty 1

## 2023-08-15 MED ORDER — ACETAMINOPHEN 650 MG RE SUPP: 650.0000 mg | Freq: Four times a day (QID) | RECTAL | Status: DC | PRN

## 2023-08-15 MED ORDER — SODIUM CHLORIDE 0.9 % IV SOLN
1.0000 g | INTRAVENOUS | Status: DC
  Administered 2023-08-16: 1 g via INTRAVENOUS
  Filled 2023-08-15: qty 10

## 2023-08-15 MED ORDER — LACTATED RINGERS IV BOLUS
500.0000 mL | Freq: Once | INTRAVENOUS | Status: AC
  Administered 2023-08-15: 500 mL via INTRAVENOUS

## 2023-08-15 MED ORDER — SODIUM CHLORIDE 0.9 % IV SOLN
1.0000 g | Freq: Once | INTRAVENOUS | Status: AC
  Administered 2023-08-15: 1 g via INTRAVENOUS
  Filled 2023-08-15: qty 10

## 2023-08-15 MED ORDER — ENOXAPARIN SODIUM 30 MG/0.3ML IJ SOSY
30.0000 mg | PREFILLED_SYRINGE | INTRAMUSCULAR | Status: DC
  Administered 2023-08-15: 30 mg via SUBCUTANEOUS
  Filled 2023-08-15: qty 0.3

## 2023-08-15 MED ORDER — HYDROCODONE-ACETAMINOPHEN 5-325 MG PO TABS: 1.0000 | ORAL_TABLET | ORAL | Status: DC | PRN

## 2023-08-15 MED ORDER — MORPHINE SULFATE (PF) 2 MG/ML IV SOLN: 1.0000 mg | INTRAVENOUS | Status: DC | PRN

## 2023-08-15 MED ORDER — SODIUM BICARBONATE 650 MG PO TABS
650.0000 mg | ORAL_TABLET | Freq: Two times a day (BID) | ORAL | Status: AC
  Administered 2023-08-15 – 2023-08-16 (×4): 650 mg via ORAL
  Filled 2023-08-15 (×4): qty 1

## 2023-08-15 MED ORDER — PREGABALIN 25 MG PO CAPS
25.0000 mg | ORAL_CAPSULE | Freq: Two times a day (BID) | ORAL | Status: DC
  Administered 2023-08-15 – 2023-08-19 (×9): 25 mg via ORAL
  Filled 2023-08-15 (×9): qty 1

## 2023-08-15 MED ORDER — DOCUSATE SODIUM 100 MG PO CAPS
200.0000 mg | ORAL_CAPSULE | Freq: Two times a day (BID) | ORAL | Status: DC | PRN
  Administered 2023-08-19: 200 mg via ORAL
  Filled 2023-08-15: qty 2

## 2023-08-15 MED ORDER — ENSURE PLUS HIGH PROTEIN PO LIQD
237.0000 mL | Freq: Two times a day (BID) | ORAL | Status: DC
  Administered 2023-08-15 – 2023-08-18 (×4): 237 mL via ORAL

## 2023-08-15 MED ORDER — ONDANSETRON HCL 4 MG/2ML IJ SOLN: 4.0000 mg | Freq: Four times a day (QID) | INTRAMUSCULAR | Status: DC | PRN

## 2023-08-15 MED ORDER — ASPIRIN 81 MG PO TBEC
81.0000 mg | DELAYED_RELEASE_TABLET | Freq: Every day | ORAL | Status: DC
Start: 2023-08-15 — End: 2023-08-19
  Administered 2023-08-15 – 2023-08-19 (×5): 81 mg via ORAL
  Filled 2023-08-15 (×5): qty 1

## 2023-08-15 MED ORDER — SODIUM CHLORIDE 0.9 % IV BOLUS
500.0000 mL | Freq: Once | INTRAVENOUS | Status: AC
  Administered 2023-08-15: 500 mL via INTRAVENOUS

## 2023-08-15 MED ORDER — ACETAMINOPHEN 325 MG PO TABS: 650.0000 mg | ORAL_TABLET | Freq: Four times a day (QID) | ORAL | Status: DC | PRN

## 2023-08-15 NOTE — ED Notes (Signed)
 Nonskid socks applied, fall risk bracelet verified on

## 2023-08-15 NOTE — ED Notes (Signed)
 Foley present on arrival removed at this time, pt tolerated well.

## 2023-08-15 NOTE — Plan of Care (Signed)

## 2023-08-15 NOTE — ED Provider Notes (Signed)
 [facility] Provider Note    Event Date/Time   First MD Initiated Contact with Patient 08/15/23 [phone]     (approximate)   History   Hypotension   HPI  Krystal [name] is a 83 y.o. female  plicated by aortic stenosis s/p TAVR, HTN, stage 3 CKD, osteoporosis, and baseline mild cognitive impairment who presents from her skilled nursing facility with episodes of transient low blood pressures for several days.  She received 1 L of fluid IV at her skilled nursing facility last night.  Today she dropped to 90/45 and received 500 cc of IV fluid via EMS en route.  The report is that she has had decreased output in her Foley.  Patient denies any abdominal pain chest pain shortness of breath.  Per report she has not had any febrile episodes      Physical Exam   Triage Vital Signs: ED Triage Vitals [08/15/23 0814]  Encounter Vitals Group     BP      Girls Systolic BP Percentile      Girls Diastolic BP Percentile      Boys Systolic BP Percentile      Boys Diastolic BP Percentile      Pulse      Resp      Temp      Temp src      SpO2      Weight 104 lb 8 oz (47.4 kg)     Height      Head Circumference      Peak Flow      Pain Score 0     Pain Loc      Pain Education      Exclude from Growth Chart     Most recent vital signs: Vitals:   08/15/23 1100 08/15/23 1130  BP: (!) 93/45   Pulse: (!) 49 (!) 52  Resp: 13 18  Temp:    SpO2: 100% 100%    Nursing Triage Note reviewed. Vital signs reviewed and patients oxygen saturation is normoxic  General: Patient is well nourished, well developed, awake and alert, Head: Normocephalic and atraumatic Eyes: Normal inspection, extraocular muscles intact, no conjunctival pallor Ear, nose, throat: Normal external exam Dry mucous membranes Neck: Normal range of motion Respiratory: Patient is in no respiratory distress, lungs CTAB Cardiovascular: Patient is not tachycardic, RR GI: Abd SNT with no guarding or  rebound  GU: Foley in place draining yellow urine but only 100 cc in the bag Back: Normal inspection of the back with good strength and range of motion throughout all ext Extremities: pulses intact with good cap refills, no LE pitting edema or calf tenderness Neuro: The patient is alert and oriented to person, place, to time, easily confused.  No cranial deficits, no new focal neurological deficits   ED Results / Procedures / Treatments   Labs (all labs ordered are listed, but only abnormal results are displayed) Labs Reviewed  CBC WITH DIFFERENTIAL/PLATELET - Abnormal; Notable for the following components:      Result Value   RBC 3.18 (*)    Hemoglobin 9.7 (*)    HCT 30.4 (*)    All other components within normal limits  COMPREHENSIVE METABOLIC PANEL WITH GFR - Abnormal; Notable for the following components:   Chloride 112 (*)    CO2 21 (*)    BUN 43 (*)    Creatinine, Ser 1.42 (*)    Calcium 8.2 (*)    Total Protein 5.2 (*)  Albumin 2.6 (*)    GFR, Estimated 36 (*)    All other components within normal limits  URINALYSIS, ROUTINE W REFLEX MICROSCOPIC - Abnormal; Notable for the following components:   Color, Urine YELLOW (*)    APPearance CLOUDY (*)    Leukocytes,Ua LARGE (*)    Bacteria, UA MANY (*)    All other components within normal limits  URINE CULTURE  CULTURE, BLOOD (ROUTINE X 2)  CULTURE, BLOOD (ROUTINE X 2)  LACTIC ACID, PLASMA  BRAIN NATRIURETIC PEPTIDE  PROCALCITONIN  CBG MONITORING, ED     EKG EKG and rhythm strip are interpreted by myself:   EKG: [Normal sinus rhythm] at heart rate of 56, wide QRS duration, QTc 474, nonspecific ST segments and T waves no ectopy EKG not consistent with Acute STEMI Rhythm strip: NSRin lead II Patient does have a left bundle branch block that has been present since 01/10/2024  RADIOLOGY X-ray chest: No pneumonia on my independent review interpretation radiologist reads this is no acute  abnormality    PROCEDURES:  Critical Care performed: No  Procedures   MEDICATIONS ORDERED IN ED: Medications  sodium chloride  0.9 % bolus 500 mL (has no administration in time range)  lactated ringers  bolus 500 mL (0 mLs Intravenous Stopped 08/15/23 0934)  cefTRIAXone  (ROCEPHIN ) 1 g in sodium chloride  0.9 % 100 mL IVPB (0 g Intravenous Stopped 08/15/23 1119)     IMPRESSION / MDM / ASSESSMENT AND PLAN / ED COURSE                                Differential diagnosis includes, but is not limited to, hypovolemia, acute renal insufficiency, UTI, pneumonia, sepsis, electrolyte derangement   ED course: Patient appears hypovolemic on exam she is afebrile and not hypotensive on arrival but she did receive 500 cc of IV fluid.  We did give her another 500 cc.  She did have an acute renal insufficiency.  We did exchange out the Foley and the repeat urinalysis was consistent with UTI.  She was given a dose of ceftriaxone .  She was continued to be observed the patient did drop her blood pressure again.  It is odd to me that her heart rate does not increase--but she is on amlodipine per chart review.  Given that she does have a Foley in place and her urinalysis is consistent with a UTI and I would like to avoid fluoroquinolones if at all possible we discussed admission for continued observation with the hospitalist   Clinical Course as of 08/15/23 1153  Mon Aug 15, 2023  0901 CBC with Differential(!) No leukocytosis [HD]  0901 HCT(!): 30.4 Slightly lower than baseline which is 34 7 months ago but no reports of dark stools [HD]  0928 B Natriuretic Peptide: 37.1 Has room to go on IV fluids [HD]  0929 Lactic Acid, Venous: 0.7 Not elevated [HD]  0929 Urinalysis, Routine w reflex microscopic -Urine, Catheterized(!) This was out of the new Foley cath at all and does have leukocytosis and bacteria.  Will send for urine culture [HD]  0958 Creatinine(!): 1.42 More elevated than previous [HD]  0958  On review, patient does not have any microbiology data of her urinalysis but given her acute renal insufficiency, large amount of leukocytes bacteria and a clean straight cath specimen will plan to start antibiotics with ceftriaxone  and likely send the patient home with cefdinir.  I realize that this does not cover  for Pseudomonas and hopefully this will not grow out Pseudomonas but would prefer to avoid all fluoroquinolones in this age group especially with dementia already [HD]  1034-08-26 DG Chest 2 View No evidence of pneumonia [HD]  1137 BP(!): 93/45 [HD]  1138 BP(!): 93/45 [HD]  1138 Patient did drop her blood pressure again despite being stable.  Will add on blood cultures, unfortunately this was already done just after the ceftriaxone .  Will discuss an observation admission with the hospitalist [HD]    Clinical Course User Index [HD] Nicholaus Rolland BRAVO, MD   Data(2/3 categories following were performed): I reviewed or ordered at least three unique tests, external notes, and/or the history required an independent historian as one of the three requirements as following: At least 3 labs/imaging studies were obtained and/or reviewed. AND  I discussed the management of the patient with the following external physician or qualified healthcare provider: Admitting physician  Risk: This patient has a high risk of morbidity due to further diagnostic testing or treatment. Rationale: Decision made regarding admission  Admit Level 5 - Labs/Rads, Admit, Consult:  Suggested E/M Coding Level: 5, 99285  This level has been selected based on the 2021/08/25 CPT guidelines for E/M codes in the Emergency Department based on 2/3 of the CoPA, Data, and Risk.   FINAL CLINICAL IMPRESSION(S) / ED DIAGNOSES   Final diagnoses:  Urinary tract infection associated with indwelling urethral catheter, initial encounter (HCC)  Transient hypotension  Acute renal insufficiency  Foley catheter in place     Rx / DC Orders    ED Discharge Orders     None        Note:  This document was prepared using Dragon voice recognition software and may include unintentional dictation errors.   Nicholaus Rolland BRAVO, MD 08/15/23 1153

## 2023-08-15 NOTE — ED Triage Notes (Signed)
 Per ems pt from [facility] due to low BP since last night and low urine output for several days. Pt has a foley.  90/45 60 HR 500 ml's Nacl Pt AOX4, NAD noted.

## 2023-08-15 NOTE — H&P (Signed)
 History and Physical    Krystal [name] FMW:[id number] DOB: 05-27-1940 DOA: 08/15/2023  PCP: Dreama Alan Kaufmann, FNP  Patient coming from: [facility]  Chief Complaint: hypotension  HPI: 83 y/o F w/ HTN, HLD, CKDIIIb, likely peripheral neuropathy, likely dementia vs mild cognitive impairment who presented w/ low blood pressure. Pt is a poor historian and hx was obtain mostly from chart review, ER physician and pt's son in law, [place]. Pt was sent from [facility] for transient hypotension as per ER physician. Pt denies any fever, chills, sweating, cough, shortness of breath, chest pain, nausea, vomiting, dysuria, diarrhea or constipation. Pt has foley that was possibly placed 8 days ago in [facility] as per pt's son in law, Jacinto, for urinary retention. Foley was exchanged in the ER on 08/15/23 as per ER physician.  Review of Systems: As per HPI otherwise 14 point review of systems negative.    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 No pertinent surgical history.   reports that she quit smoking about 41 years ago. Her smoking use included cigarettes. She started smoking about 65 years ago. She has a 24 pack-year smoking history. She has never used smokeless tobacco. She reports current drug use. Drug: Oxycodone . She reports that she does not drink alcohol.  Allergies  Allergen Reactions   Rsv Mrna Pre-F Virus Vaccine     Body aches and pain, nausea and weakness    Family History  Problem Relation Age of Onset   Heart disease Mother    Diabetes Father    Heart disease Father    Breast cancer Sister    Breast cancer Sister     Prior to Admission medications   Medication Sig Start Date End Date Taking? Authorizing Provider  acetaminophen  (TYLENOL ) 500 MG tablet Take 500 mg by mouth every 6 (six) hours as needed for mild pain (pain score 1-3).   Yes [provider]  alendronate (FOSAMAX) 70 MG tablet Take 70 mg by mouth once a week. sunday 07/27/22  Yes [provider]  amLODipine (NORVASC) 5 MG tablet Take 1 tablet by mouth daily. 03/02/19  Yes [provider]  aspirin  EC 81 MG tablet Take 81 mg by mouth daily.   Yes [provider]  enalapril (VASOTEC) 20 MG tablet Take 0.5 tablets by mouth daily. 02/13/19  Yes [provider]  polyethylene glycol (MIRALAX / GLYCOLAX) 17 g packet Take 17 g by mouth daily as needed for mild constipation.   Yes [provider]  pregabalin  (LYRICA ) 25 MG capsule Take 25 mg by mouth 2 (two) times daily. 12/30/22 02/01/23 Yes [provider]  sennosides-docusate sodium  (SENOKOT-S) 8.6-50 MG tablet Take 1 tablet by mouth in the morning and at bedtime.   Yes [provider]  sertraline  (ZOLOFT ) 50 MG tablet Take 1 tablet by mouth daily. 07/26/22  Yes [provider]  simvastatin  (ZOCOR ) 20 MG tablet Take 1 tablet by mouth at bedtime. 02/13/19  Yes [provider]  sodium chloride  0.9 % infusion Inject into the vein once.   Yes [provider]  lidocaine  (LIDODERM ) 5 % Place 1 patch onto the skin every 12 (twelve) hours. Remove & Discard patch within 12 hours or as directed by MD Patient not taking: Reported on 08/15/2023 01/09/23 01/09/24  Willo Dunnings, MD  Multiple Vitamins-Minerals (PRESERVISION AREDS) CAPS Take 1 capsule by  mouth daily. Patient not taking: Reported on 08/15/2023    [provider]  Nutritional Supplements (SALMON OIL PO) Take 1 capsule by mouth daily.    [provider]  Omega-3 1000 MG CAPS Take 1 capsule by mouth daily. Patient not taking: Reported on 08/15/2023    [provider]  oxyCODONE  (ROXICODONE ) 5 MG immediate release tablet Take 1 tablet (5 mg total) by mouth every 8 (eight) hours as needed. Patient not taking: Reported on 02/01/2023 01/09/23 01/09/24  Willo Dunnings, MD    Physical  Exam: Vitals:   08/15/23 1100 08/15/23 1130 08/15/23 1200 08/15/23 1230  BP: (!) 93/45 (!) 107/33 (!) 116/38 (!) 118/45  Pulse: (!) 49 (!) 52 (!) 50 (!) 49  Resp: 13 18 13 16   Temp:      TempSrc:      SpO2: 100% 100% 100% 100%  Weight:        Constitutional: NAD, calm, comfortable. Frail appearing  Vitals:   08/15/23 1100 08/15/23 1130 08/15/23 1200 08/15/23 1230  BP: (!) 93/45 (!) 107/33 (!) 116/38 (!) 118/45  Pulse: (!) 49 (!) 52 (!) 50 (!) 49  Resp: 13 18 13 16   Temp:      TempSrc:      SpO2: 100% 100% 100% 100%  Weight:       Eyes: PERRL, lids and conjunctivae normal ENMT: Mucous membranes are moist. Neck: normal, supple Respiratory: clear to auscultation bilaterally, no wheezing, no crackles. Normal respiratory effort. No accessory muscle use.  Cardiovascular:S1/S2+, no rubs / gallops. No extremity edema.  Abdomen: soft, no tenderness, ND, hypoactive bowel sounds Musculoskeletal: no clubbing / cyanosis. No joint deformity upper and lower extremities. Skin: no rashes, lesions.  Neurologic: CN 2-12 grossly intact. Decreased strength of b/l LE  Psychiatric: Alert & oriented to person, place & month only. Flat mood and affect      Labs on Admission: I have personally reviewed following labs and imaging studies  CBC: Recent Labs  Lab 08/15/23 0820  WBC 7.6  NEUTROABS 5.5  HGB 9.7*  HCT 30.4*  MCV 95.6  PLT 157   Basic Metabolic Panel: Recent Labs  Lab 08/15/23 0820  NA 141  K 4.0  CL 112*  CO2 21*  GLUCOSE 85  BUN 43*  CREATININE 1.42*  CALCIUM 8.2*   GFR: Estimated Creatinine Clearance: 20.8 mL/min (A) (by C-G formula based on SCr of 1.42 mg/dL (H)). Liver Function Tests: Recent Labs  Lab 08/15/23 0820  AST 15  ALT 11  ALKPHOS 83  BILITOT 0.3  PROT 5.2*  ALBUMIN 2.6*   No results for input(s): LIPASE, AMYLASE in the last 168 hours. No results for input(s): AMMONIA in the last 168 hours. Coagulation Profile: No results for input(s):  INR, PROTIME in the last 168 hours. Cardiac Enzymes: No results for input(s): CKTOTAL, CKMB, CKMBINDEX, TROPONINI in the last 168 hours. BNP (last 3 results) No results for input(s): PROBNP in the last 8760 hours. HbA1C: No results for input(s): HGBA1C in the last 72 hours. CBG: Recent Labs  Lab 08/15/23 0818  GLUCAP 75   Lipid Profile: No results for input(s): CHOL, HDL, LDLCALC, TRIG, CHOLHDL, LDLDIRECT in the last 72 hours. Thyroid  Function Tests: No results for input(s): TSH, T4TOTAL, FREET4, T3FREE, THYROIDAB in the last 72 hours. Anemia Panel: No results for input(s): VITAMINB12, FOLATE, FERRITIN, TIBC, IRON, RETICCTPCT in the last 72 hours. Urine analysis:    Component Value Date/Time   COLORURINE YELLOW (A) 08/15/2023 0820  APPEARANCEUR CLOUDY (A) 08/15/2023 0820   LABSPEC 1.017 08/15/2023 0820   PHURINE 5.0 08/15/2023 0820   GLUCOSEU NEGATIVE 08/15/2023 0820   HGBUR NEGATIVE 08/15/2023 0820   BILIRUBINUR NEGATIVE 08/15/2023 0820   KETONESUR NEGATIVE 08/15/2023 0820   PROTEINUR NEGATIVE 08/15/2023 0820   NITRITE NEGATIVE 08/15/2023 0820   LEUKOCYTESUR LARGE (A) 08/15/2023 0820    Radiological Exams on Admission: DG Chest 2 View Result Date: 08/15/2023 CLINICAL DATA:  Hypotension EXAM: CHEST - 2 VIEW COMPARISON:  Chest radiograph dated 01/09/2023, CT chest dated 05/05/2023 FINDINGS: Similar asymmetric elevation of the right hemidiaphragm. Normal lung volumes. No focal consolidations. No pleural effusion or pneumothorax. Normal cardiomediastinal silhouette status post aortic valve replacement. Similar old right lateral rib fractures. Interposed colon below the right hemidiaphragm. IMPRESSION: 1. No active cardiopulmonary disease. 2. Similar asymmetric elevation of the right hemidiaphragm. Electronically Signed   By: Limin  Xu M.D.   On: 08/15/2023 09:48    EKG: Independently reviewed.   Assessment/Plan Principal  Problem:   Complicated UTI (urinary tract infection)  Complicated UTI: likely secondary to foley. Foley exchanged in ER on 08/15/23 as per ER physician. Urine cx is pending. Continue on IV rocephin . Blood cxs taken after IV abxs given  Possible urinary retention: foley exchanged in ER 08/15/23. Possibly placed in [facility] 8 days ago for urinary retention as per pt's son in law  CKDIIIb: decreased urine output as per ER physician. Will continue to monitor   Metabolic acidosis: will start bicarb   ACD: likely secondary to CKD. No need for a transfusion currently   Possible dementia vs mild cognitive impairment: continue w/ supportive care   HTN: holding home amlodipine, enalapril as BP is low  Hypotension: continue on IVFs. Holding all home anti-HTN meds  HLD: continue on statin   Depression: severity unknown. Continue on home dose of sertraline   Likely peripheral neuropathy: continue on home dose of pregabalin   Dementia: continue w/ supportive care  Likely osteoporosis: holding home alendronate   DVT prophylaxis: lovenox  Code Status: DNR Family Communication: discussed pt's care w/ pt's son in law, [name], and answered his questions  Disposition Plan: likely back to home facility  Consults called: none Admission status: inpatient    Anthony CHRISTELLA Pouch MD [facility]   If 7PM-7AM, please contact night-coverage [email or web address]  08/15/2023, 12:48 PM

## 2023-08-16 DIAGNOSIS — N390 Urinary tract infection, site not specified: Secondary | ICD-10-CM | POA: Diagnosis not present

## 2023-08-16 LAB — COMPREHENSIVE METABOLIC PANEL WITH GFR
ALT: 11 U/L (ref 0–44)
AST: 15 U/L (ref 15–41)
Albumin: 2.6 g/dL — ABNORMAL LOW (ref 3.5–5.0)
Alkaline Phosphatase: 84 U/L (ref 38–126)
Anion gap: 7 (ref 5–15)
BUN: 29 mg/dL — ABNORMAL HIGH (ref 8–23)
CO2: 22 mmol/L (ref 22–32)
Calcium: 8.2 mg/dL — ABNORMAL LOW (ref 8.9–10.3)
Chloride: 111 mmol/L (ref 98–111)
Creatinine, Ser: 0.86 mg/dL (ref 0.44–1.00)
GFR, Estimated: >60 mL/min (ref >60)
Glucose, Bld: 70 mg/dL (ref 70–99)
Potassium: 4.2 mmol/L (ref 3.5–5.1)
Sodium: 140 mmol/L (ref 135–145)
Total Bilirubin: 0.4 mg/dL (ref 0.0–1.2)
Total Protein: 5.3 g/dL — ABNORMAL LOW (ref 6.5–8.1)

## 2023-08-16 LAB — CBC
HCT: 30.9 % — ABNORMAL LOW (ref 36.0–46.0)
Hemoglobin: 9.8 g/dL — ABNORMAL LOW (ref 12.0–15.0)
MCH: 30.2 pg (ref 26.0–34.0)
MCHC: 31.7 g/dL (ref 30.0–36.0)
MCV: 95.1 fL (ref 80.0–100.0)
Platelets: 150 K/uL (ref 150–400)
RBC: 3.25 MIL/uL — ABNORMAL LOW (ref 3.87–5.11)
RDW: 13.3 % (ref 11.5–15.5)
WBC: 6.6 K/uL (ref 4.0–10.5)
nRBC: 0 % (ref 0.0–0.2)

## 2023-08-16 LAB — URINE CULTURE
Culture: 70000 — AB
Special Requests: NORMAL

## 2023-08-16 LAB — PHOSPHORUS: Phosphorus: 2.3 mg/dL — ABNORMAL LOW (ref 2.5–4.6)

## 2023-08-16 LAB — MAGNESIUM: Magnesium: 1.9 mg/dL (ref 1.7–2.4)

## 2023-08-16 MED ORDER — CHLORHEXIDINE GLUCONATE CLOTH 2 % EX PADS
6.0000 | MEDICATED_PAD | Freq: Every day | CUTANEOUS | Status: DC
  Administered 2023-08-16 – 2023-08-19 (×4): 6 via TOPICAL

## 2023-08-16 MED ORDER — ENOXAPARIN SODIUM 40 MG/0.4ML IJ SOSY
40.0000 mg | PREFILLED_SYRINGE | INTRAMUSCULAR | Status: DC
  Administered 2023-08-16 – 2023-08-18 (×3): 40 mg via SUBCUTANEOUS
  Filled 2023-08-16 (×3): qty 0.4

## 2023-08-16 MED ORDER — FLUCONAZOLE IN SODIUM CHLORIDE 200-0.9 MG/100ML-% IV SOLN
200.0000 mg | INTRAVENOUS | Status: DC
  Administered 2023-08-16: 200 mg via INTRAVENOUS
  Filled 2023-08-16 (×2): qty 100

## 2023-08-16 NOTE — Plan of Care (Signed)

## 2023-08-16 NOTE — Evaluation (Signed)
 Physical Therapy Evaluation Patient Details Name: Krystal [name] MRN: [id number] DOB: Apr 10, 1940 Today's Date: 08/16/2023  History of Present Illness  Pt is an 83 yo female that presented to ED for transient hypotension, UTI. 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  Clinical Impression  Pt alert, oriented to self only. Able to follow on step commands with extra time, multimodal cues, and intermittently needed facilitation to initiate mobility. Pt questionable historian, per chart was recently at [facility]. Pt did attempt to explain this but unable to recall name of place. The pt required extra time and modA to come up sitting EOB. Noted for strong posterior/L lateral lean that did not improve significantly with time, min-modA to maintain sitting/midline. Sit <> stand attempted twice with RW and maxAx2, unable to truly come up into fully standing due to strong posterior lean (unable to move BLE underneath her, and L knee resistant to flexion in seated position. Able to heel slide in supine however.). MaxA to return to supine safely.  Overall the patient demonstrated deficits (see PT Problem List) that impede the patient's functional abilities, safety, and mobility and would benefit from skilled PT intervention.          If plan is discharge home, recommend the following: Two people to help with walking and/or transfers;Two people to help with bathing/dressing/bathroom;Direct supervision/assist for medications management;Help with stairs or ramp for entrance;Assist for transportation;Assistance with cooking/housework   Can travel by private vehicle   No    Equipment Recommendations Other (comment) (TBD)  Recommendations for Other Services       Functional Status Assessment Patient has had a recent decline in their functional status and demonstrates the ability to make significant improvements in function in a reasonable and  predictable amount of time.     Precautions / Restrictions Precautions Precautions: Fall Recall of Precautions/Restrictions: Impaired Restrictions Weight Bearing Restrictions Per Provider Order: No      Mobility  Bed Mobility Overal bed mobility: Needs Assistance Bed Mobility: Supine to Sit, Sit to Supine     Supine to sit: Mod assist, HOB elevated Sit to supine: Max assist        Transfers Overall transfer level: Needs assistance Equipment used: Rolling walker (2 wheels) Transfers: Sit to/from Stand Sit to Stand: +2 physical assistance, Max assist                Ambulation/Gait               General Gait Details: unable at this time  Stairs            Wheelchair Mobility     Tilt Bed    Modified Rankin (Stroke Patients Only)       Balance Overall balance assessment: Needs assistance Sitting-balance support: Feet supported, Bilateral upper extremity supported Sitting balance-Leahy Scale: Poor Sitting balance - Comments: poor to zero balance in sitting, L posterior lean noted nearly throughout entirety of sitting, mod-maxA to return to midline and pt unable to maintain Postural control: Posterior lean, Left lateral lean Standing balance support: Bilateral upper extremity supported, During functional activity Standing balance-Leahy Scale: Zero Standing balance comment: strong posterior push, unable to truly step back and get her feet underneath her                             Pertinent Vitals/Pain Pain Assessment Pain Assessment: Faces Faces Pain Scale: No hurt  Home Living                     Additional Comments: pt unable to answer PLOF questions but did seem to want to report that she was at [facility] recently but unable to recall name. per CSW from STR from [facility]    Prior Function                       Extremity/Trunk Assessment   Upper Extremity Assessment Upper Extremity Assessment: Generalized  weakness    Lower Extremity Assessment Lower Extremity Assessment: Generalized weakness (able to lift BLE against gravity)       Communication        Cognition Arousal: Alert Behavior During Therapy: WFL for tasks assessed/performed   PT - Cognitive impairments: No family/caregiver present to determine baseline, History of cognitive impairments                       PT - Cognition Comments: pt oriented to self only Following commands: Impaired Following commands impaired: Follows one step commands with increased time     Cueing Cueing Techniques: Verbal cues, Gestural cues, Tactile cues, Visual cues     General Comments      Exercises     Assessment/Plan    PT Assessment Patient needs continued PT services  PT Problem List Decreased strength;Decreased activity tolerance;Decreased balance;Decreased mobility       PT Treatment Interventions DME instruction;Balance training;Gait training;Neuromuscular re-education;Stair training;Functional mobility training;Patient/family education;Therapeutic exercise;Therapeutic activities    PT Goals (Current goals can be found in the Care Plan section)  Acute Rehab PT Goals Patient Stated Goal: to go home PT Goal Formulation: With patient Time For Goal Achievement: 08/30/23 Potential to Achieve Goals: Fair    Frequency Min 2X/week     Co-evaluation               AM-PAC PT 6 Clicks Mobility  Outcome Measure Help needed turning from your back to your side while in a flat bed without using bedrails?: A Lot Help needed moving from lying on your back to sitting on the side of a flat bed without using bedrails?: A Lot Help needed moving to and from a bed to a chair (including a wheelchair)?: A Lot Help needed standing up from a chair using your arms (e.g., wheelchair or bedside chair)?: A Lot Help needed to walk in hospital room?: A Lot Help needed climbing 3-5 steps with a railing? : A Lot 6 Click Score:  12    End of Session Equipment Utilized During Treatment: Gait belt Activity Tolerance: Patient tolerated treatment well Patient left: in bed;with call bell/phone within reach;with bed alarm set Nurse Communication: Mobility status PT Visit Diagnosis: Other abnormalities of gait and mobility (R26.89);Difficulty in walking, not elsewhere classified (R26.2);Muscle weakness (generalized) (M62.81)    Time: 8665-8643 PT Time Calculation (min) (ACUTE ONLY): 22 min   Charges:   PT Evaluation $PT Eval Low Complexity: 1 Low PT Treatments $Therapeutic Activity: 8-22 mins PT General Charges $$ ACUTE PT VISIT: 1 Visit        Doyal Shams PT, DPT 3:01 PM,08/16/23

## 2023-08-16 NOTE — Progress Notes (Signed)
 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 Pt is a poor historian and hx was obtain mostly from chart review, ER physician and pt's son in law, [place]. Pt was sent from [facility] for transient hypotension as per ER physician. Pt denies any fever, chills, sweating, cough, shortness of breath, chest pain, nausea, vomiting, dysuria, diarrhea or constipation. Pt has foley that was possibly placed 8 days ago in [facility] as per pt's son in law, [place], for urinary retention. Foley was exchanged in the ER on 08/15/23 as per ER physician.    Krystal [name]  FMW:[id number] DOB: [birth year] DOA: 08/15/2023 PCP: Dreama Alan Kaufmann, FNP  Assessment & Plan:   Principal Problem:   Complicated UTI (urinary tract infection)  Assessment and Plan:  Complicated UTI: likely secondary to foley. Foley exchanged in ER on 08/15/23 as per ER physician. Urine cx is growing yeast. So d/c IV rocephin  and start IV fluconazole . Blood cxs taken after IV abxs given & NGTD   Possible urinary retention: foley exchanged in ER 08/15/23. Possibly placed in [facility] 8 days ago for urinary retention as per pt's son in law. Can do voiding trial while inpatient and if pt fails, consider uro consult    CKDIIIb: decreased urine output as per ER physician. Will continue to monitor    Metabolic acidosis: will start bicarb    ACD: likely secondary to CKD. No need for a transfusion currently    Possible dementia vs mild cognitive impairment: continue w/ supportive care    HTN: holding home amlodipine, enalapril as BP is low   Hypotension: continue on IVFs. Holding all home anti-HTN meds   HLD: continue on statin    Depression: severity unknown. Continue on home dose of sertraline    Likely peripheral neuropathy: continue on home dose of pregabalin    Dementia: continue w/ supportive care   Likely  osteoporosis: holding home alendronate     DVT prophylaxis: lovenox   Code Status: DNR Family Communication: discussed pt's care w/ pt's family at bedside and answered their questions  Disposition Plan:  depends on PT/OT recs  Level of care: Telemetry Medical  Status is: Inpatient Remains inpatient appropriate because: severity of illness, requiring IV abxs    Consultants:    Procedures:   Antimicrobials:   Subjective: Pt c/o fatigue.  Objective: Vitals:   08/15/23 1555 08/15/23 1944 08/16/23 0433 08/16/23 0808  BP: (!) 104/47 136/60 (!) 128/51 (!) 127/48  Pulse: (!) 55 (!) 51 (!) 56 (!) 56  Resp: 19 18  18   Temp: (!) 97.5 F (36.4 C) 97.6 F (36.4 C) 97.8 F (36.6 C) 98 F (36.7 C)  TempSrc: Oral   Oral  SpO2: 99% 99% 100% 97%  Weight:      Height:        Intake/Output Summary (Last 24 hours) at 08/16/2023 0819 Last data filed at 08/16/2023 0441 Gross per 24 hour  Intake 981.85 ml  Output 1450 ml  Net -468.15 ml   Filed Weights   08/15/23 0814  Weight: 47.4 kg    Examination:  General exam: Appears calm and comfortable  Respiratory system: Clear to auscultation. Respiratory effort normal. Cardiovascular system: S1 & S2+. No  rubs, gallops or clicks.  Gastrointestinal system: Abdomen is nondistended, soft and nontender. Normal bowel sounds heard. Central nervous system: Alert and awake. Moves all extremities  Psychiatry: Judgement and insight appears poor. Flat  mood and affect    Data Reviewed: I have personally reviewed following labs and imaging studies  CBC: Recent Labs  Lab 08/15/23 0820 08/16/23 0527  WBC 7.6 6.6  NEUTROABS 5.5  --   HGB 9.7* 9.8*  HCT 30.4* 30.9*  MCV 95.6 95.1  PLT 157 150   Basic Metabolic Panel: Recent Labs  Lab 08/15/23 0820 08/16/23 0527  NA 141 140  K 4.0 4.2  CL 112* 111  CO2 21* 22  GLUCOSE 85 70  BUN 43* 29*  CREATININE 1.42* 0.86  CALCIUM 8.2* 8.2*  MG  --  1.9  PHOS  --  2.3*    GFR: Estimated Creatinine Clearance: 34.4 mL/min (by C-G formula based on SCr of 0.86 mg/dL). Liver Function Tests: Recent Labs  Lab 08/15/23 0820 08/16/23 0527  AST 15 15  ALT 11 11  ALKPHOS 83 84  BILITOT 0.3 0.4  PROT 5.2* 5.3*  ALBUMIN 2.6* 2.6*   No results for input(s): LIPASE, AMYLASE in the last 168 hours. No results for input(s): AMMONIA in the last 168 hours. Coagulation Profile: No results for input(s): INR, PROTIME in the last 168 hours. Cardiac Enzymes: No results for input(s): CKTOTAL, CKMB, CKMBINDEX, TROPONINI in the last 168 hours. BNP (last 3 results) No results for input(s): PROBNP in the last 8760 hours. HbA1C: No results for input(s): HGBA1C in the last 72 hours. CBG: Recent Labs  Lab 08/15/23 0818  GLUCAP 75   Lipid Profile: No results for input(s): CHOL, HDL, LDLCALC, TRIG, CHOLHDL, LDLDIRECT in the last 72 hours. Thyroid  Function Tests: No results for input(s): TSH, T4TOTAL, FREET4, T3FREE, THYROIDAB in the last 72 hours. Anemia Panel: No results for input(s): VITAMINB12, FOLATE, FERRITIN, TIBC, IRON, RETICCTPCT in the last 72 hours. Sepsis Labs: Recent Labs  Lab 08/15/23 0820  PROCALCITON <0.10  LATICACIDVEN 0.7    Recent Results (from the past 240 hours)  Blood culture (routine x 2)     Status: None (Preliminary result)   Collection Time: 08/15/23  5:17 PM   Specimen: BLOOD  Result Value Ref Range Status   Specimen Description BLOOD BLOOD LEFT ARM  Final   Special Requests   Final    BOTTLES DRAWN AEROBIC AND ANAEROBIC Blood Culture adequate volume   Culture   Final    NO GROWTH < 24 HOURS Performed at [facility], [address], [place], [place] [address]    Report Status PENDING  Incomplete  Blood culture (routine x 2)     Status: None (Preliminary result)   Collection Time: 08/15/23  9:20 PM   Specimen: BLOOD  Result Value Ref Range Status   Specimen  Description BLOOD BLOOD LEFT HAND  Final   Special Requests   Final    BOTTLES DRAWN AEROBIC AND ANAEROBIC Blood Culture adequate volume   Culture   Final    NO GROWTH < 12 HOURS Performed at [facility], [address] [place], [place] [address]    Report Status PENDING  Incomplete         Radiology Studies: DG Chest 2 View Result Date: 08/15/2023 CLINICAL DATA:  Hypotension EXAM: CHEST - 2 VIEW COMPARISON:  Chest radiograph dated 01/09/2023, CT chest dated 05/05/2023 FINDINGS: Similar asymmetric elevation of the right hemidiaphragm. Normal lung volumes. No focal consolidations. No pleural effusion or pneumothorax. Normal cardiomediastinal silhouette status post aortic valve replacement. Similar old right lateral rib fractures. Interposed colon below the right hemidiaphragm. IMPRESSION: 1. No active cardiopulmonary disease. 2. Similar  asymmetric elevation of the right hemidiaphragm. Electronically Signed   By: Limin  Xu M.D.   On: 08/15/2023 09:48        Scheduled Meds:  aspirin  EC  81 mg Oral Daily   enoxaparin  (LOVENOX ) injection  30 mg Subcutaneous Q24H   feeding supplement  237 mL Oral BID BM   pregabalin   25 mg Oral BID   sertraline   50 mg Oral Daily   simvastatin   20 mg Oral QHS   sodium bicarbonate   650 mg Oral BID   sodium chloride  flush  3 mL Intravenous Q12H   Continuous Infusions:  sodium chloride  75 mL/hr at 08/16/23 0420   cefTRIAXone  (ROCEPHIN )  IV       LOS: 1 day      Anthony CHRISTELLA Pouch, MD [facility] Pager [phone]  If 7PM-7AM, please contact night-coverage [email or web address] 08/16/2023, 8:19 AM

## 2023-08-16 NOTE — Evaluation (Signed)
 Occupational Therapy Evaluation Patient Details Name: Krystal [name] MRN: [id number] DOB: 04-16-1940 Today's Date: 08/16/2023   History of Present Illness   Pt is an 83 yo female that presented to ED for transient hypotension, UTI. 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     Clinical Impressions Patient presenting with decreased Ind in self care,balance, functional mobility/transfers, endurance, and safety awareness. Per chart review, pt ,most recently at [facility] for rehab. Pt living alone prior with family assisting but having multiple falls at home. Patient currently functioning at mod A for bed mobility and max A to stand on EOB and take several steps to the R with RW secondary to posterior bias. Pt returning to bed at end of session secondary to fatigue.  Patient will benefit from acute OT to increase overall independence in the areas of ADLs, functional mobility, and safety awareness in order to safely discharge.     If plan is discharge home, recommend the following:   A lot of help with walking and/or transfers;A lot of help with bathing/dressing/bathroom;Direct supervision/assist for medications management;Direct supervision/assist for financial management;Assist for transportation;Help with stairs or ramp for entrance;Supervision due to cognitive status     Functional Status Assessment   Patient has had a recent decline in their functional status and demonstrates the ability to make significant improvements in function in a reasonable and predictable amount of time.     Equipment Recommendations   None recommended by OT (defer to next venue of care)      Precautions/Restrictions   Precautions Precautions: Fall Recall of Precautions/Restrictions: Impaired Restrictions Weight Bearing Restrictions Per Provider Order: No     Mobility Bed Mobility Overal bed mobility: Needs Assistance Bed Mobility: Supine to  Sit, Sit to Supine     Supine to sit: Mod assist, HOB elevated Sit to supine: Mod assist        Transfers Overall transfer level: Needs assistance Equipment used: Rolling walker (2 wheels) Transfers: Sit to/from Stand Sit to Stand: Max assist                  Balance Overall balance assessment: Needs assistance Sitting-balance support: Feet supported, Bilateral upper extremity supported Sitting balance-Leahy Scale: Fair   Postural control: Posterior lean Standing balance support: Bilateral upper extremity supported, During functional activity Standing balance-Leahy Scale: Poor                             ADL either performed or assessed with clinical judgement   ADL Overall ADL's : Needs assistance/impaired     Grooming: Wash/dry hands;Wash/dry face;Sitting;Set up;Supervision/safety                   Toilet Transfer: Maximal assistance;Ambulation;Rolling walker (2 wheels) Toilet Transfer Details (indicate cue type and reason): simulated                 Vision Patient Visual Report: No change from baseline              Pertinent Vitals/Pain Pain Assessment Pain Assessment: Faces Faces Pain Scale: No hurt     Extremity/Trunk Assessment Upper Extremity Assessment Upper Extremity Assessment: Generalized weakness   Lower Extremity Assessment Lower Extremity Assessment: Generalized weakness       Communication Communication Communication: No apparent difficulties   Cognition Arousal: Alert Behavior During Therapy: WFL for tasks assessed/performed Cognition: History of cognitive impairments  Following commands: Impaired Following commands impaired: Follows one step commands with increased time     Cueing  General Comments   Cueing Techniques: Verbal cues;Gestural cues;Tactile cues;Visual cues              Home Living Family/patient expects to be discharged to:: Skilled  nursing facility Living Arrangements: Other (Comment)                               Additional Comments: Per chart review, pt living in mobile home near ex- son in law who has been assisting with IADLs but pt has been falling and he is unable to continue to care for her. After most recent admission,pt at SNF for rehab but may be transitioning to LTC?      Prior Functioning/Environment Prior Level of Function : Patient poor historian/Family not available                    OT Problem List: Decreased strength;Impaired balance (sitting and/or standing);Decreased cognition;Pain;Decreased knowledge of precautions;Decreased range of motion;Decreased safety awareness;Cardiopulmonary status limiting activity;Decreased activity tolerance;Decreased knowledge of use of DME or AE   OT Treatment/Interventions: Self-care/ADL training;Manual therapy;Therapeutic exercise;Patient/family education;Balance training;Energy conservation;Therapeutic activities;DME and/or AE instruction      OT Goals(Current goals can be found in the care plan section)   Acute Rehab OT Goals Patient Stated Goal: to go home OT Goal Formulation: With patient Time For Goal Achievement: 08/30/23 Potential to Achieve Goals: Fair ADL Goals Pt Will Perform Grooming: with supervision;standing Pt Will Perform Lower Body Dressing: with min assist;sit to/from stand Pt Will Transfer to Toilet: with min assist;ambulating Pt Will Perform Toileting - Clothing Manipulation and hygiene: with min assist;sit to/from stand   OT Frequency:  Min 2X/week       AM-PAC OT 6 Clicks Daily Activity     Outcome Measure Help from another person eating meals?: None Help from another person taking care of personal grooming?: A Little Help from another person toileting, which includes using toliet, bedpan, or urinal?: A Lot Help from another person bathing (including washing, rinsing, drying)?: A Lot Help from another person  to put on and taking off regular upper body clothing?: A Little Help from another person to put on and taking off regular lower body clothing?: A Lot 6 Click Score: 16   End of Session Equipment Utilized During Treatment: Rolling walker (2 wheels) Nurse Communication: Mobility status  Activity Tolerance: Patient limited by fatigue Patient left: in bed;with call bell/phone within reach;with bed alarm set  OT Visit Diagnosis: Unsteadiness on feet (R26.81);Repeated falls (R29.6);Muscle weakness (generalized) (M62.81)                Time: 8877-8860 OT Time Calculation (min): 17 min Charges:  OT General Charges $OT Visit: 1 Visit OT Evaluation $OT Eval Low Complexity: 1 Low OT Treatments $Therapeutic Activity: 8-22 mins Izetta Claude, MS, OTR/L , CBIS ascom [phone]  08/16/23, 3:22 PM

## 2023-08-17 DIAGNOSIS — N390 Urinary tract infection, site not specified: Secondary | ICD-10-CM | POA: Diagnosis not present

## 2023-08-17 LAB — CBC
HCT: 33.3 % — ABNORMAL LOW (ref 36.0–46.0)
Hemoglobin: 11 g/dL — ABNORMAL LOW (ref 12.0–15.0)
MCH: 30.5 pg (ref 26.0–34.0)
MCHC: 33 g/dL (ref 30.0–36.0)
MCV: 92.2 fL (ref 80.0–100.0)
Platelets: 151 K/uL (ref 150–400)
RBC: 3.61 MIL/uL — ABNORMAL LOW (ref 3.87–5.11)
RDW: 13.1 % (ref 11.5–15.5)
WBC: 8.1 K/uL (ref 4.0–10.5)
nRBC: 0 % (ref 0.0–0.2)

## 2023-08-17 LAB — BASIC METABOLIC PANEL WITH GFR
Anion gap: 8 (ref 5–15)
BUN: 15 mg/dL (ref 8–23)
CO2: 22 mmol/L (ref 22–32)
Calcium: 8.6 mg/dL — ABNORMAL LOW (ref 8.9–10.3)
Chloride: 108 mmol/L (ref 98–111)
Creatinine, Ser: 0.71 mg/dL (ref 0.44–1.00)
GFR, Estimated: >60 mL/min (ref >60)
Glucose, Bld: 93 mg/dL (ref 70–99)
Potassium: 3.7 mmol/L (ref 3.5–5.1)
Sodium: 138 mmol/L (ref 135–145)

## 2023-08-17 MED ORDER — MELATONIN 5 MG PO TABS
5.0000 mg | ORAL_TABLET | Freq: Every evening | ORAL | Status: DC | PRN
  Administered 2023-08-17 – 2023-08-18 (×2): 5 mg via ORAL
  Filled 2023-08-17 (×2): qty 1

## 2023-08-17 MED ORDER — FLUCONAZOLE 100 MG PO TABS
100.0000 mg | ORAL_TABLET | Freq: Every day | ORAL | Status: DC
  Administered 2023-08-17 – 2023-08-19 (×3): 100 mg via ORAL
  Filled 2023-08-17 (×3): qty 1

## 2023-08-17 NOTE — NC FL2 (Signed)
 [place]  MEDICAID [id number] LEVEL OF CARE FORM     IDENTIFICATION  Patient Name: Amrie BIRKEL Birthdate: 1940-01-24 Sex: female Admission Date (Current Location): 08/15/2023  [facility] and [place] Number:  Chiropodist and Address:  [facility], [address] [place], [place] [address]      Provider Number: [id number]  Attending Physician Name and Address:  Dorinda Drue DASEN, MD  Relative Name and Phone Number:  [name] Mealing [phone]    Current Level of Care: Hospital Recommended Level of Care: Skilled Nursing Facility Prior Approval Number:    Date Approved/Denied:   PASRR Number: [id number]  Discharge Plan: SNF    Current Diagnoses: Patient Active Problem List   Diagnosis Date Noted   Complicated UTI (urinary tract infection) 08/15/2023    Orientation RESPIRATION BLADDER Height & Weight     Self, Place    Indwelling catheter Weight: 47.4 kg Height:  5' (152.4 cm)  BEHAVIORAL SYMPTOMS/MOOD NEUROLOGICAL BOWEL NUTRITION STATUS      Incontinent Diet (REgular)  AMBULATORY STATUS COMMUNICATION OF NEEDS Skin     Verbally                         Personal Care Assistance Level of Assistance  Bathing, Feeding, Dressing Bathing Assistance: Limited assistance Feeding assistance: Independent Dressing Assistance: Limited assistance     Functional Limitations Info  Hearing, Speech, Sight Sight Info: Adequate Hearing Info: Adequate Speech Info: Adequate    SPECIAL CARE FACTORS FREQUENCY  PT (By licensed PT), OT (By licensed OT)     PT Frequency: 5 x week OT Frequency: 5 x week            Contractures      Additional Factors Info  Code Status, Allergies Code Status Info: DNR Allergies Info: Rsv Mrna Pre-f Virus Vaccine           Current Medications (08/17/2023):  This is the current hospital active medication list Current Facility-Administered Medications  Medication Dose Route Frequency Provider Last Rate Last  Admin   0.9 %  sodium chloride  infusion   Intravenous Continuous Trudy Anthony HERO, MD 75 mL/hr at 08/17/23 0726 New Bag at 08/17/23 0726   acetaminophen  (TYLENOL ) tablet 650 mg  650 mg Oral Q6H PRN Trudy Anthony HERO, MD       Or   acetaminophen  (TYLENOL ) suppository 650 mg  650 mg Rectal Q6H PRN Trudy Anthony HERO, MD       aspirin  EC tablet 81 mg  81 mg Oral Daily Trudy Anthony HERO, MD   81 mg at 08/17/23 0818   bisacodyl  (DULCOLAX) EC tablet 5 mg  5 mg Oral Daily PRN Trudy Anthony HERO, MD       Chlorhexidine  Gluconate Cloth 2 % PADS 6 each  6 each Topical Daily Trudy Anthony HERO, MD   6 each at 08/17/23 9160   docusate sodium  (COLACE) capsule 200 mg  200 mg Oral BID PRN Trudy Anthony HERO, MD       enoxaparin  (LOVENOX ) injection 40 mg  40 mg Subcutaneous Q24H Hallaji, Sheema M, RPH   40 mg at 08/16/23 1714   feeding supplement (ENSURE PLUS HIGH PROTEIN) liquid 237 mL  237 mL Oral BID BM Trudy Anthony HERO, MD   237 mL at 08/17/23 0818   fluconazole  (DIFLUCAN ) IVPB 200 mg  200 mg Intravenous Q24H Trudy Anthony HERO, MD   Stopped at 08/16/23 1529   HYDROcodone -acetaminophen  (NORCO/VICODIN) 5-325 MG per  tablet 1-2 tablet  1-2 tablet Oral Q4H PRN Williams, Jamiese M, MD       morphine  (PF) 2 MG/ML injection 1 mg  1 mg Intravenous Q4H PRN Williams, Jamiese M, MD       ondansetron  (ZOFRAN ) tablet 4 mg  4 mg Oral Q6H PRN Trudy Anthony HERO, MD       Or   ondansetron  (ZOFRAN ) injection 4 mg  4 mg Intravenous Q6H PRN Williams, Jamiese M, MD       Oral care mouth rinse  15 mL Mouth Rinse PRN Trudy Anthony HERO, MD       pregabalin  (LYRICA ) capsule 25 mg  25 mg Oral BID Trudy Anthony HERO, MD   25 mg at 08/17/23 0818   sertraline  (ZOLOFT ) tablet 50 mg  50 mg Oral Daily Trudy Anthony HERO, MD   50 mg at 08/17/23 0818   simvastatin  (ZOCOR ) tablet 20 mg  20 mg Oral QHS Trudy Anthony HERO, MD   20 mg at 08/16/23 2111   sodium chloride  flush (NS) 0.9 % injection 3 mL  3 mL Intravenous Q12H  Trudy Anthony HERO, MD   3 mL at 08/17/23 0818     Discharge Medications: Please see discharge summary for a list of discharge medications.  Relevant Imaging Results:  Relevant Lab Results:   Additional Information SS# [id number]  Dalia GORMAN Fuse, RN

## 2023-08-17 NOTE — Progress Notes (Signed)
 Occupational Therapy Treatment Patient Details Name: Krystal [name] MRN: [id number] DOB: [birth year] Today's Date: 08/17/2023   History of present illness Pt is an 83 yo female that presented to ED for transient hypotension, UTI. 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   OT comments  Upon entering the room, pt supine in bed eating [employer]. Pt reports her DIL made it for her and brought it in. Pt needing mod - max A for bed mobility and stands with max A from standard bed height. Pt stand pivot transfers from bed > recliner chair with max A and posterior bias. Tray placed back in front of her for her to continue eat. OT recommended that nursing use STEDY for safety when transferring her back to bed. Call bell and all needed items within reach upon exiting the room.       If plan is discharge home, recommend the following:  A lot of help with walking and/or transfers;A lot of help with bathing/dressing/bathroom;Direct supervision/assist for medications management;Direct supervision/assist for financial management;Assist for transportation;Help with stairs or ramp for entrance;Supervision due to cognitive status   Equipment Recommendations  None recommended by OT       Precautions / Restrictions Precautions Precautions: Fall Recall of Precautions/Restrictions: Impaired       Mobility Bed Mobility Overal bed mobility: Needs Assistance Bed Mobility: Supine to Sit     Supine to sit: Mod assist          Transfers Overall transfer level: Needs assistance Equipment used: 1 person hand held assist Transfers: Sit to/from Stand Sit to Stand: Max assist                 Balance Overall balance assessment: Needs assistance Sitting-balance support: Feet supported, Bilateral upper extremity supported Sitting balance-Leahy Scale: Fair   Postural control: Posterior lean Standing balance support: During functional  activity, Bilateral upper extremity supported Standing balance-Leahy Scale: Poor                             ADL either performed or assessed with clinical judgement    Extremity/Trunk Assessment Upper Extremity Assessment Upper Extremity Assessment: Generalized weakness   Lower Extremity Assessment Lower Extremity Assessment: Generalized weakness        Vision Patient Visual Report: No change from baseline           Communication Communication Communication: No apparent difficulties   Cognition Arousal: Alert Behavior During Therapy: WFL for tasks assessed/performed Cognition: History of cognitive impairments                               Following commands: Impaired Following commands impaired: Follows one step commands with increased time      Cueing   Cueing Techniques: Verbal cues, Gestural cues, Tactile cues, Visual cues             Pertinent Vitals/ Pain       Pain Assessment Pain Assessment: No/denies pain         Frequency  Min 2X/week        Progress Toward Goals  OT Goals(current goals can now be found in the care plan section)  Progress towards OT goals: Progressing toward goals      AM-PAC OT 6 Clicks Daily Activity     Outcome Measure   Help from another person eating meals?: None  Help from another person taking care of personal grooming?: A Little Help from another person toileting, which includes using toliet, bedpan, or urinal?: A Lot Help from another person bathing (including washing, rinsing, drying)?: A Lot Help from another person to put on and taking off regular upper body clothing?: A Little Help from another person to put on and taking off regular lower body clothing?: A Lot 6 Click Score: 16    End of Session    OT Visit Diagnosis: Unsteadiness on feet (R26.81);Repeated falls (R29.6);Muscle weakness (generalized) (M62.81)   Activity Tolerance Patient limited by fatigue   Patient Left in  chair;with call bell/phone within reach;with chair alarm set   Nurse Communication Mobility status        Time: 8896-8882 OT Time Calculation (min): 14 min  Charges: OT General Charges $OT Visit: 1 Visit OT Treatments $Therapeutic Activity: 8-22 mins  Izetta Claude, MS, OTR/L , CBIS ascom [phone]  08/17/23, 1:50 PM

## 2023-08-17 NOTE — Progress Notes (Signed)
 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 Pt is a poor historian and hx was obtain mostly from chart review, ER physician and pt's son in law, [place]. Pt was sent from [facility] for transient hypotension as per ER physician. Pt denies any fever, chills, sweating, cough, shortness of breath, chest pain, nausea, vomiting, dysuria, diarrhea or constipation. Pt has foley that was possibly placed 8 days ago in [facility] as per pt's son in law, [place], for urinary retention. Foley was exchanged in the ER on 08/15/23 as per ER physician.    Assessment & Plan:   Principal Problem:   Complicated UTI (urinary tract infection)   Assessment and Plan:   Complicated UTI: likely secondary to foley. Foley exchanged in ER on 08/15/23 as per ER physician. Urine cx is growing yeast. Continue fluconazole  Follow-up final culture results   Possible urinary retention: foley exchanged in ER 08/15/23. Possibly placed in [facility] 8 days ago for urinary retention as per pt's son in law. Can do voiding trial while inpatient and if pt fails, consider uro consult    CKDIIIb: decreased urine output as per ER physician. Will continue to monitor    Metabolic acidosis: will start bicarb    ACD: likely secondary to CKD. No need for a transfusion currently    Possible dementia vs mild cognitive impairment: continue w/ supportive care    HTN: holding home amlodipine, enalapril as BP is low   Hypotension: continue on IVFs. Holding all home anti-HTN meds   HLD: continue on statin    Depression: severity unknown. Continue on home dose of sertraline    Likely peripheral neuropathy: continue on home dose of pregabalin    Dementia: continue w/ supportive care    Likely osteoporosis: holding home alendronate       DVT prophylaxis: lovenox   Code Status: DNR Family Communication: discussed pt's care w/ pt's family at bedside and answered their questions  Disposition Plan:  depends on PT/OT recs   Level of care: Telemetry Medical   Status is: Inpatient Remains inpatient appropriate because: severity of illness, requiring IV abxs      Subjective:  Patient seen and examined at bedside this morning Urinary retention much better today Still has Foley in place Cultures pending Denies nausea vomiting abdominal pain chest pain  Physical Exam: General exam: Appears calm and comfortable  Respiratory system: Clear to auscultation. Respiratory effort normal. Cardiovascular system: S1 & S2+. No  rubs, gallops or clicks.  Gastrointestinal system: Abdomen is nondistended, soft and nontender. Normal bowel sounds heard. Central nervous system: Alert and awake. Moves all extremities  Psychiatry: Judgement and insight appears poor. Flat mood and affect     Vitals:   08/16/23 2013 08/17/23 0512 08/17/23 0753 08/17/23 1552  BP: (!) 143/79 (!) 155/68 (!) 148/68 129/67  Pulse: 76 69 65 66  Resp: 20 (!) 24    Temp: 97.6 F (36.4 C) 97.9 F (36.6 C) 97.7 F (36.5 C) 98.4 F (36.9 C)  TempSrc:   Oral Oral  SpO2: 97% 99% 99% 96%  Weight:      Height:        Data Reviewed:     Latest Ref Rng & Units 08/17/2023    4:36 AM 08/16/2023  5:27 AM 08/15/2023    8:20 AM  CBC  WBC 4.0 - 10.5 K/uL 8.1  6.6  7.6   Hemoglobin 12.0 - 15.0 g/dL 88.9  9.8  9.7   Hematocrit 36.0 - 46.0 % 33.3  30.9  30.4   Platelets 150 - 400 K/uL 151  150  157        Latest Ref Rng & Units 08/17/2023    4:36 AM 08/16/2023    5:27 AM 08/15/2023    8:20 AM  BMP  Glucose 70 - 99 mg/dL 93  70  85   BUN 8 - 23 mg/dL 15  29  43   Creatinine 0.44 - 1.00 mg/dL 9.28  9.13  8.57   Sodium 135 - 145 mmol/L 138  140  141   Potassium 3.5 - 5.1 mmol/L 3.7  4.2  4.0   Chloride 98 -  111 mmol/L 108  111  112   CO2 22 - 32 mmol/L 22  22  21    Calcium 8.9 - 10.3 mg/dL 8.6  8.2  8.2      Family Communication: Discussed with family at bedside  Author: Drue ONEIDA Potter, MD 08/17/2023 5:34 PM  For on call review [email or web address].

## 2023-08-17 NOTE — Progress Notes (Signed)
  Chaplain On-Call responded to Spiritual Care Consult Order from Jacques Molly, RN.  The request was to assist patient and family with questions about Advance Directives. Chaplain received update from [employer]  Chaplain met the patient, her son [name], and her daughter Apolinar in the room. The patient and family stated that the patient wants to complete the Health Care Power of Attorney document. Chaplain offered education about the [facility] and the process for hospital completion.  Chaplain received assistance from [facility] [place] [facility] [place], and Employee Notary Blane Sours, RN, to complete the document. Chaplain placed a copy in the patient's Notebook Chart, and returned the original to the patient.  Chaplain Bebe Ardean EMERSON Hershal., [facility]

## 2023-08-17 NOTE — Progress Notes (Signed)
 PHARMACIST - PHYSICIAN COMMUNICATION  CONCERNING: Antibiotic IV to Oral Route Change Policy  RECOMMENDATION: This patient is receiving Fluconazole  by the intravenous route.  Based on criteria approved by the Pharmacy and Therapeutics Committee, the antibiotic(s) is/are being converted to the equivalent oral dose form(s).   DESCRIPTION: These criteria include: Patient being treated for a respiratory tract infection, urinary tract infection, cellulitis or clostridium difficile associated diarrhea if on metronidazole The patient is not neutropenic and does not exhibit a GI malabsorption state The patient is eating (either orally or via tube) and/or has been taking other orally administered medications for a least 24 hours The patient is improving clinically and has a Tmax < 100.5  If you have questions about this conversion, please contact the Pharmacy Department   Estill CHRISTELLA Lutes, PharmD, BCPS Clinical Pharmacist 08/17/2023 1:25 PM

## 2023-08-17 NOTE — TOC Progression Note (Signed)
 Transition of Care [facility]) - Progression Note    Patient Details  Name: Krystal [name] MRN: [id number] Date of Birth: 1940/07/29  Transition of Care [facility]) CM/SW Contact  Dalia GORMAN Fuse, RN Phone Number: 08/17/2023, 2:43 PM  Clinical Narrative:     Therapy recommends SNF for STR. TOC visited the patient in the room. The patient was confused and couldn't answer questions appropriately. TOC outreached to the patient's son DEANNA [name] CROME [phone], he would like for the patient to return to [facility]. TOC outreached Ricky at [employer] to make him aware. Nitchia to start insurance auth.     Expected Discharge Plan: Skilled Nursing Facility Barriers to Discharge: Continued Medical Work up               Expected Discharge Plan and Services     Post Acute Care Choice: Skilled Nursing Facility Living arrangements for the past 2 months: Skilled Nursing Facility                                       Social Drivers of Health (SDOH) Interventions SDOH Screenings   Food Insecurity: No Food Insecurity (08/15/2023)  Housing: Low Risk  (08/15/2023)  Transportation Needs: No Transportation Needs (08/15/2023)  Utilities: Not At Risk (08/15/2023)  Depression (PHQ2-9): Low Risk  (01/10/2023)  Financial Resource Strain: Low Risk  (05/18/2023)   Received from [facility]  Social Connections: Socially Isolated (08/15/2023)  Tobacco Use: Medium Risk (08/15/2023)  Health Literacy: Medium Risk (01/29/2021)   Received from [facility]    Readmission Risk Interventions     No data to display

## 2023-08-17 NOTE — TOC Initial Note (Incomplete)
 Transition of Care [facility]) - Initial/Assessment Note    Patient Details  Name: Krystal [name] MRN: [id number] Date of Birth: [birth year]  Transition [facility]) CM/SW Contact:    Dalia GORMAN Fuse, RN Phone Number: 08/17/2023, 12:33 PM  Clinical Narrative:                  Patient presented to [facility] from [facility]   Expected Discharge Plan: Skilled Nursing Facility Barriers to Discharge: Continued Medical Work up   Patient Goals and CMS Choice     Choice offered to / list presented to : Adult Children      Expected Discharge Plan and Services     Post Acute Care Choice: Skilled Nursing Facility Living arrangements for the past 2 months: Skilled Nursing Facility                                      Prior Living Arrangements/Services Living arrangements for the past 2 months: Skilled Nursing Facility                     Activities of Daily Living   ADL Screening (condition at time of admission) Independently performs ADLs?: No Does the patient have a NEW difficulty with bathing/dressing/toileting/self-feeding that is expected to last >3 days?: Yes (Initiates electronic notice to provider for possible OT consult) Does the patient have a NEW difficulty with getting in/out of bed, walking, or climbing stairs that is expected to last >3 days?: Yes (Initiates electronic notice to provider for possible PT consult) Does the patient have a NEW difficulty with communication that is expected to last >3 days?: No Is the patient deaf or have difficulty hearing?: No Does the patient have difficulty seeing, even when wearing glasses/contacts?: No Does the patient have difficulty concentrating, remembering, or making decisions?: No  Permission Sought/Granted                  Emotional Assessment       Orientation: : Oriented to Self, Oriented to Place   Psych Involvement: No (comment)  Admission diagnosis:  Acute renal insufficiency [N28.9] Foley  catheter in place [Z97.8] Complicated UTI (urinary tract infection) [N39.0] Transient hypotension [I95.9] Urinary tract infection associated with indwelling urethral catheter, initial encounter (HCC) [U16.488J, N39.0] Patient Active Problem List   Diagnosis Date Noted   Complicated UTI (urinary tract infection) 08/15/2023   PCP:  Dreama Alan Kaufmann, FNP Pharmacy:   [facility] [address] [place] - [address] [place] [address] Phone: [phone] Fax: [phone]     Social Drivers of Health (SDOH) Social History: SDOH Screenings   Food Insecurity: No Food Insecurity (08/15/2023)  Housing: Low Risk  (08/15/2023)  Transportation Needs: No Transportation Needs (08/15/2023)  Utilities: Not At Risk (08/15/2023)  Depression (PHQ2-9): Low Risk  (01/10/2023)  Financial Resource Strain: Low Risk  (05/18/2023)   Received from [facility]  Social Connections: Socially Isolated (08/15/2023)  Tobacco Use: Medium Risk (08/15/2023)  Health Literacy: Medium Risk (01/29/2021)   Received from [facility]   SDOH Interventions:     Readmission Risk Interventions     No data to display

## 2023-08-18 DIAGNOSIS — N390 Urinary tract infection, site not specified: Secondary | ICD-10-CM | POA: Diagnosis not present

## 2023-08-18 LAB — BASIC METABOLIC PANEL WITH GFR
Anion gap: 9 (ref 5–15)
BUN: 12 mg/dL (ref 8–23)
CO2: 20 mmol/L — ABNORMAL LOW (ref 22–32)
Calcium: 8.2 mg/dL — ABNORMAL LOW (ref 8.9–10.3)
Chloride: 111 mmol/L (ref 98–111)
Creatinine, Ser: 0.67 mg/dL (ref 0.44–1.00)
GFR, Estimated: >60 mL/min (ref >60)
Glucose, Bld: 76 mg/dL (ref 70–99)
Potassium: 3.6 mmol/L (ref 3.5–5.1)
Sodium: 140 mmol/L (ref 135–145)

## 2023-08-18 LAB — CBC WITH DIFFERENTIAL/PLATELET
Abs Immature Granulocytes: 0.02 K/uL (ref 0.00–0.07)
Basophils Absolute: 0 K/uL (ref 0.0–0.1)
Basophils Relative: 0 %
Eosinophils Absolute: 0.2 K/uL (ref 0.0–0.5)
Eosinophils Relative: 5 %
HCT: 30.9 % — ABNORMAL LOW (ref 36.0–46.0)
Hemoglobin: 10.1 g/dL — ABNORMAL LOW (ref 12.0–15.0)
Immature Granulocytes: 0 %
Lymphocytes Relative: 19 %
Lymphs Abs: 0.9 K/uL (ref 0.7–4.0)
MCH: 30.1 pg (ref 26.0–34.0)
MCHC: 32.7 g/dL (ref 30.0–36.0)
MCV: 92.2 fL (ref 80.0–100.0)
Monocytes Absolute: 0.3 K/uL (ref 0.1–1.0)
Monocytes Relative: 6 %
Neutro Abs: 3.6 K/uL (ref 1.7–7.7)
Neutrophils Relative %: 70 %
Platelets: 141 K/uL — ABNORMAL LOW (ref 150–400)
RBC: 3.35 MIL/uL — ABNORMAL LOW (ref 3.87–5.11)
RDW: 13.2 % (ref 11.5–15.5)
WBC: 5.1 K/uL (ref 4.0–10.5)
nRBC: 0 % (ref 0.0–0.2)

## 2023-08-18 NOTE — Plan of Care (Signed)

## 2023-08-18 NOTE — Progress Notes (Signed)
 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 Pt is a poor historian and hx was obtain mostly from chart review, ER physician and pt's son in law, [place]. Pt was sent from [facility] for transient hypotension as per ER physician. Pt denies any fever, chills, sweating, cough, shortness of breath, chest pain, nausea, vomiting, dysuria, diarrhea or constipation. Pt has foley that was possibly placed 8 days ago in [facility] as per pt's son in law, Krystal [name], for urinary retention. Foley was exchanged in the ER on 08/15/23 as per ER physician.    Assessment & Plan:   Principal Problem:   Complicated UTI (urinary tract infection)   Assessment and Plan:   Complicated UTI: likely secondary to foley. Foley exchanged in ER on 08/15/23 as per ER physician. Urine cx is growing yeast. Continue fluconazole  Follow-up final culture results   Possible urinary retention: foley exchanged in ER 08/15/23. Possibly placed in [facility] 8 days ago for urinary retention as per pt's son in law. Can do voiding trial while inpatient and if pt fails, consider uro consult    CKDIIIb: decreased urine output as per ER physician. Will continue to monitor    Metabolic acidosis: Continue bicarb   ACD: likely secondary to CKD. No need for a transfusion currently    Possible dementia vs mild cognitive impairment: continue w/ supportive care    HTN: holding home amlodipine, enalapril as BP is low   Hypotension: continue on IVFs. Holding all home anti-HTN meds   HLD: continue on statin    Depression: severity unknown. Continue on home dose of sertraline    Likely peripheral neuropathy: continue on home dose of pregabalin    Dementia: continue w/ supportive care    Likely osteoporosis: holding home alendronate        DVT prophylaxis: lovenox   Code Status: DNR Family Communication: discussed pt's care w/ pt's family at bedside and answered their questions  Disposition Plan:  depends on PT/OT recs   Level of care: Telemetry Medical   Status is: Inpatient Remains inpatient appropriate because: severity of illness, requiring IV abxs       Subjective:  Patient seen and examined at bedside this morning Urinary retention much better today Denies nausea vomiting chest pain  Physical Exam: General exam: Appears calm and comfortable  Respiratory system: Clear to auscultation. Respiratory effort normal. Cardiovascular system: S1 & S2+. No  rubs, gallops or clicks.  Gastrointestinal system: Abdomen is nondistended, soft and nontender. Normal bowel sounds heard. Central nervous system: Alert and awake. Moves all extremities  Psychiatry: Judgement and insight appears poor. Flat mood and affect        Data Reviewed:    Vitals:   08/17/23 2017 08/18/23 0441 08/18/23 0748 08/18/23 1536  BP: (!) 147/83 135/66 (!) 144/55 (!) 141/52  Pulse: 72 61 61 (!) 58  Resp: 18  16 16   Temp: 97.8 F (36.6 C) (!) 97.5 F (36.4 C) 98.3 F (36.8 C) 97.6 F (36.4 C)  TempSrc: Oral Oral Oral   SpO2: 100% 97% 97% 100%  Weight:      Height:          Latest Ref Rng & Units 08/18/2023    5:20 AM 08/17/2023  4:36 AM 08/16/2023    5:27 AM  BMP  Glucose 70 - 99 mg/dL 76  93  70   BUN 8 - 23 mg/dL 12  15  29    Creatinine 0.44 - 1.00 mg/dL 9.32  9.28  9.13   Sodium 135 - 145 mmol/L 140  138  140   Potassium 3.5 - 5.1 mmol/L 3.6  3.7  4.2   Chloride 98 - 111 mmol/L 111  108  111   CO2 22 - 32 mmol/L 20  22  22    Calcium 8.9 - 10.3 mg/dL 8.2  8.6  8.2      Author: Drue ONEIDA Potter, MD 08/18/2023 6:08 PM  For on call review [email or web address].

## 2023-08-18 NOTE — TOC Progression Note (Addendum)
 Transition of Care [facility]) - Progression Note    Patient Details  Name: Krystal [name] MRN: [id number] Date of Birth: [birth year]  [facility]) CM/SW Contact  Dalia GORMAN Fuse, RN Phone Number: 08/18/2023, 11:28 AM  Clinical Narrative:    Plan for patient to go to [facility] when medically ready for discharge. Approved PlanAuthID:[id number] Dates:7/31-08/22/2023 Next Review Date: 08/22/2023   13:53 TOC left message for Daril at [facility] making him aware that we anticipate the patient will be ready for discharge tomorrow.   Expected Discharge Plan: Skilled Nursing Facility Barriers to Discharge: Continued Medical Work up               Expected Discharge Plan and Services     Post Acute Care Choice: Skilled Nursing Facility Living arrangements for the past 2 months: Skilled Nursing Facility                                       Social Drivers of Health (SDOH) Interventions SDOH Screenings   Food Insecurity: No Food Insecurity (08/15/2023)  Housing: Low Risk  (08/15/2023)  Transportation Needs: No Transportation Needs (08/15/2023)  Utilities: Not At Risk (08/15/2023)  Depression (PHQ2-9): Low Risk  (01/10/2023)  Financial Resource Strain: Low Risk  (05/18/2023)   Received from [facility]  Social Connections: Socially Isolated (08/15/2023)  Tobacco Use: Medium Risk (08/15/2023)  Health Literacy: Medium Risk (01/29/2021)   Received from [facility]    Readmission Risk Interventions     No data to display

## 2023-08-18 NOTE — Progress Notes (Signed)
 Occupational Therapy Treatment Patient Details Name: Krystal Krystal [name] MRN: [id number] DOB: 14-Dec-1940 Today's Date: 08/18/2023   History of present illness Pt is an 83 yo female that presented to ED for transient hypotension, UTI. 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   OT comments  Pt seen for OT treatment on this date. Upon arrival to room pt supine in bed with HOB elevated asleep, with gentle arousing pt agreeable to tx. Pt requires Mod A for bed mobility supine to sit with cuing for hand placement to support activity.  At EOB, pt completed face washing activity with Supervision following initial set-up.  Pt completed sit to stand transfer with skilled instruction on hand/foot placement with Max A for transition to standing at RW and Mod A to side step toward HOB.  Pt returned to Supine with Mod A with HOB elevated and positioned for meal tray set-up.   Pt making progress toward goals, Krystal [name] continue to follow POC. Discharge recommendation remains appropriate and pt's RN notified of pt status.       If plan is discharge home, recommend the following:      Equipment Recommendations       Recommendations for Other Services      Precautions / Restrictions Precautions Precautions: Fall Recall of Precautions/Restrictions: Impaired Restrictions Weight Bearing Restrictions Per Provider Order: No       Mobility Bed Mobility Overal bed mobility: Needs Assistance Bed Mobility: Supine to Sit     Supine to sit: Mod assist Sit to supine: Mod assist        Transfers Overall transfer level: Needs assistance Equipment used: Rolling walker (2 wheels) Transfers: Sit to/from Stand Sit to Stand: Max assist           General transfer comment: simulated, Max A for sit to stand transfer and Mod A for side steps toward HOB.     Balance Overall balance assessment: Needs assistance Sitting-balance support: Feet supported,  Bilateral upper extremity supported Sitting balance-Leahy Scale: Fair   Postural control: Posterior lean Standing balance support: During functional activity, Bilateral upper extremity supported Standing balance-Leahy Scale: Poor Standing balance comment: simulated, Max A for sit to stand transfer and Mod A for side steps toward HOB.             High level balance activites: Side stepping High Level Balance Comments: simulated, Max A for sit to stand transfer and Mod A for side steps toward HOB.  Posterior lean slightly improved with side stepping at RW level.           ADL either performed or assessed with clinical judgement   ADL       Grooming: Wash/dry hands;Wash/dry face;Sitting;Set up;Supervision/safety                   Toilet Transfer: Maximal assistance Toilet Transfer Details (indicate cue type and reason): simulated, Max A for sit to stand transfer and Mod A for side steps toward HOB.         Functional mobility during ADLs: Moderate assistance;Rolling walker (2 wheels)      Extremity/Trunk Assessment Upper Extremity Assessment Upper Extremity Assessment: Generalized weakness            Vision       Perception     Praxis     Communication Communication Communication: No apparent difficulties   Cognition Arousal: Alert Behavior During Therapy: WFL for tasks assessed/performed Cognition: History of cognitive impairments  Following commands: Impaired Following commands impaired: Follows one step commands with increased time      Cueing   Cueing Techniques: Verbal cues, Gestural cues, Tactile cues, Visual cues  Exercises Other Exercises Other Exercises: Education on weight shifting to break posterior leaning in static standing, education on hand/foot placement during functional sit to stand transfers.    Shoulder Instructions       General Comments      Pertinent Vitals/ Pain        Pain Assessment Pain Assessment: No/denies pain  Home Living                                          Prior Functioning/Environment              Frequency  Min 2X/week        Progress Toward Goals  OT Goals(current goals can now be found in the care plan section)  Progress towards OT goals: Progressing toward goals     Plan      Co-evaluation                 AM-PAC OT 6 Clicks Daily Activity     Outcome Measure   Help from another person eating meals?: None Help from another person taking care of personal grooming?: A Little Help from another person toileting, which includes using toliet, bedpan, or urinal?: A Lot Help from another person bathing (including washing, rinsing, drying)?: A Lot Help from another person to put on and taking off regular upper body clothing?: A Little Help from another person to put on and taking off regular lower body clothing?: A Lot 6 Click Score: 16    End of Session Equipment Utilized During Treatment: Rolling walker (2 wheels);Gait belt  OT Visit Diagnosis: Unsteadiness on feet (R26.81);Repeated falls (R29.6);Muscle weakness (generalized) (M62.81)   Activity Tolerance Patient limited by fatigue   Patient Left in bed;with call bell/phone within reach;with bed alarm set   Nurse Communication Mobility status        Time: 8684-8665 OT Time Calculation (min): 19 min  Charges: OT General Charges $OT Visit: 1 Visit  Harlene Sharps OTR/L   Harlene LITTIE Sharps 08/18/2023, 1:47 PM

## 2023-08-18 NOTE — Care Management Important Message (Signed)
 Important Message  Patient Details  Name: Krystal [name] MRN: [id number] Date of Birth: 05-Oct-1940   Important Message Given:  Yes - Medicare IM     Sakura Denis W, CMA 08/18/2023, 12:02 PM

## 2023-08-19 DIAGNOSIS — N390 Urinary tract infection, site not specified: Secondary | ICD-10-CM | POA: Diagnosis not present

## 2023-08-19 LAB — CBC WITH DIFFERENTIAL/PLATELET
Abs Immature Granulocytes: 0.02 K/uL (ref 0.00–0.07)
Basophils Absolute: 0 K/uL (ref 0.0–0.1)
Basophils Relative: 0 %
Eosinophils Absolute: 0.2 K/uL (ref 0.0–0.5)
Eosinophils Relative: 2 %
HCT: 30 % — ABNORMAL LOW (ref 36.0–46.0)
Hemoglobin: 9.8 g/dL — ABNORMAL LOW (ref 12.0–15.0)
Immature Granulocytes: 0 %
Lymphocytes Relative: 9 %
Lymphs Abs: 0.7 K/uL (ref 0.7–4.0)
MCH: 30.5 pg (ref 26.0–34.0)
MCHC: 32.7 g/dL (ref 30.0–36.0)
MCV: 93.5 fL (ref 80.0–100.0)
Monocytes Absolute: 0.4 K/uL (ref 0.1–1.0)
Monocytes Relative: 5 %
Neutro Abs: 6.4 K/uL (ref 1.7–7.7)
Neutrophils Relative %: 84 %
Platelets: 147 K/uL — ABNORMAL LOW (ref 150–400)
RBC: 3.21 MIL/uL — ABNORMAL LOW (ref 3.87–5.11)
RDW: 13.2 % (ref 11.5–15.5)
WBC: 7.8 K/uL (ref 4.0–10.5)
nRBC: 0 % (ref 0.0–0.2)

## 2023-08-19 LAB — BASIC METABOLIC PANEL WITH GFR
Anion gap: 7 (ref 5–15)
BUN: 11 mg/dL (ref 8–23)
CO2: 20 mmol/L — ABNORMAL LOW (ref 22–32)
Calcium: 8.1 mg/dL — ABNORMAL LOW (ref 8.9–10.3)
Chloride: 112 mmol/L — ABNORMAL HIGH (ref 98–111)
Creatinine, Ser: 0.61 mg/dL (ref 0.44–1.00)
GFR, Estimated: >60 mL/min (ref >60)
Glucose, Bld: 103 mg/dL — ABNORMAL HIGH (ref 70–99)
Potassium: 3.4 mmol/L — ABNORMAL LOW (ref 3.5–5.1)
Sodium: 139 mmol/L (ref 135–145)

## 2023-08-19 MED ORDER — POTASSIUM CHLORIDE 20 MEQ PO PACK
40.0000 meq | PACK | Freq: Once | ORAL | Status: AC
  Administered 2023-08-19: 40 meq via ORAL
  Filled 2023-08-19: qty 2

## 2023-08-19 MED ORDER — FLUCONAZOLE 100 MG PO TABS: 100.0000 mg | ORAL_TABLET | Freq: Every day | ORAL | Status: AC

## 2023-08-19 NOTE — Plan of Care (Signed)
  Problem: Education: Goal: Knowledge of General Education information will improve Description: Including pain rating scale, medication(s)/side effects and non-pharmacologic comfort measures Outcome: Adequate for Discharge   Problem: Health Behavior/Discharge Planning: Goal: Ability to manage health-related needs will improve Outcome: Adequate for Discharge   Problem: Clinical Measurements: Goal: Ability to maintain clinical measurements within normal limits will improve Outcome: Adequate for Discharge Goal: Will remain free from infection Outcome: Adequate for Discharge Goal: Diagnostic test results will improve Outcome: Adequate for Discharge Goal: Respiratory complications will improve Outcome: Adequate for Discharge Goal: Cardiovascular complication will be avoided Outcome: Adequate for Discharge   Problem: Activity: Goal: Risk for activity intolerance will decrease Outcome: Adequate for Discharge   Problem: Nutrition: Goal: Adequate nutrition will be maintained Outcome: Adequate for Discharge   Problem: Coping: Goal: Level of anxiety will decrease Outcome: Adequate for Discharge   Problem: Elimination: Goal: Will not experience complications related to bowel motility Outcome: Adequate for Discharge Goal: Will not experience complications related to urinary retention Outcome: Adequate for Discharge   Problem: Pain Managment: Goal: General experience of comfort will improve and/or be controlled Outcome: Adequate for Discharge   Problem: Safety: Goal: Ability to remain free from injury will improve Outcome: Adequate for Discharge   Problem: Skin Integrity: Goal: Risk for impaired skin integrity will decrease Outcome: Adequate for Discharge   Problem: Urinary Elimination: Goal: Signs and symptoms of infection will decrease Outcome: Adequate for Discharge   Problem: Acute Rehab PT Goals(only PT should resolve) Goal: Pt Will Go Supine/Side To Sit Outcome:  Adequate for Discharge Goal: Patient Will Transfer Sit To/From Stand Outcome: Adequate for Discharge Goal: Pt Will Transfer Bed To Chair/Chair To Bed Outcome: Adequate for Discharge Goal: Pt Will Ambulate Outcome: Adequate for Discharge   Problem: Acute Rehab OT Goals (only OT should resolve) Goal: Pt. Will Perform Grooming Outcome: Adequate for Discharge Goal: Pt. Will Perform Lower Body Dressing Outcome: Adequate for Discharge Goal: Pt. Will Transfer To Toilet Outcome: Adequate for Discharge Goal: Pt. Will Perform Toileting-Clothing Manipulation Outcome: Adequate for Discharge

## 2023-08-19 NOTE — TOC Transition Note (Signed)
 Transition of Care [facility]) - Discharge Note   Patient Details  Name: Krystal [name] MRN: [id number] Date of Birth: 1940-04-10  Transition of Care [facility]) CM/SW Contact:  Dalia GORMAN Fuse, RN Phone Number: 08/19/2023, 11:46 AM   Clinical Narrative:    The patient is medically clear to discharge to [facility]. TOC spoke with the patient's son, [name], and he is agreeable with the discharge plan. [facility] will transport.    Final next level of care: Skilled Nursing Facility Barriers to Discharge: Barriers Resolved   Patient Goals and CMS Choice     Choice offered to / list presented to : Adult Children      Discharge Placement              Patient chooses bed at: Other - please specify in the comment section below: ([facility]) Patient to be transferred to facility by: [facility] Name of family member notified: [name] [phone] Patient and family notified of of transfer: 08/19/23  Discharge Plan and Services Additional resources added to the After Visit Summary for       Post Acute Care Choice: Skilled Nursing Facility                               Social Drivers of Health (SDOH) Interventions SDOH Screenings   Food Insecurity: No Food Insecurity (08/15/2023)  Housing: Low Risk  (08/15/2023)  Transportation Needs: No Transportation Needs (08/15/2023)  Utilities: Not At Risk (08/15/2023)  Depression (PHQ2-9): Low Risk  (01/10/2023)  Financial Resource Strain: Low Risk  (05/18/2023)   Received from [facility]  Social Connections: Socially Isolated (08/15/2023)  Tobacco Use: Medium Risk (08/15/2023)  Health Literacy: Medium Risk (01/29/2021)   Received from [facility]     Readmission Risk Interventions     No data to display

## 2023-08-19 NOTE — Progress Notes (Signed)
 Pt discharged to [facility] with [facility]. Full report provided to [facility] RN with all questions answered at this time. All PIVs removed, sites WDL. All pt belongings sent with pt. Pt discharged with indwelling foley.

## 2023-08-19 NOTE — Progress Notes (Signed)
 Physical Therapy Treatment Patient Details Name: Krystal [name] MRN: [id number] DOB: 1940/10/09 Today's Date: 08/19/2023   History of Present Illness Pt is an 83 yo female that presented to ED for transient hypotension, UTI. 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    PT Comments  Pt seen for PT tx with pt agreeable. Pt confused, reporting I can walk your legs off but requires max assist to stand at sink. Pt with poor ability to follow single step cuing with extra time. Pt with poor ability to place BLE underneath BOS for increased balance & ease of sit>stand transfers. Pt eventually able to stand & engage in grooming tasks x ~5 minutes. Throughout session pt demonstrates L hand neglect requiring max cuing to utilize it throughout session, even when washing hands. Will continue to follow pt acutely to progress mobility as able.    If plan is discharge home, recommend the following: Two people to help with walking and/or transfers;Two people to help with bathing/dressing/bathroom;Direct supervision/assist for medications management;Help with stairs or ramp for entrance;Assist for transportation;Assistance with cooking/housework   Can travel by private vehicle     No  Equipment Recommendations   (defer to next venue)    Recommendations for Other Services       Precautions / Restrictions Precautions Precautions: Fall Recall of Precautions/Restrictions: Impaired Restrictions Weight Bearing Restrictions Per Provider Order: No     Mobility  Bed Mobility Overal bed mobility: Needs Assistance Bed Mobility: Supine to Sit     Supine to sit: Min assist, Used rails, HOB elevated (extra time to come to sitting) Sit to supine: Total assist (poor ability to follow cuing to lie back down)   General bed mobility comments: max assist to fully scoot to sitting EOB, total assist to scoot to [facility] with trendelenburg position     Transfers Overall transfer level: Needs assistance Equipment used: None (sink/counter) Transfers: Sit to/from Stand Sit to Stand: Mod assist, Max assist           General transfer comment: cuing & assistance to place LUE on sink for support    Ambulation/Gait                   Stairs             Wheelchair Mobility     Tilt Bed    Modified Rankin (Stroke Patients Only)       Balance Overall balance assessment: Needs assistance   Sitting balance-Leahy Scale: Fair Sitting balance - Comments: L lateral lean, eventually improves Postural control: Left lateral lean, Posterior lean Standing balance support: Bilateral upper extremity supported, Reliant on assistive device for balance Standing balance-Leahy Scale: Poor Standing balance comment: posterior lean in standing                            Communication Communication Communication: Impaired Factors Affecting Communication: Hearing impaired  Cognition Arousal: Alert Behavior During Therapy: Anxious   PT - Cognitive impairments: No family/caregiver present to determine baseline, History of cognitive impairments                         Following commands: Impaired Following commands impaired: Follows one step commands with increased time, Follows one step commands inconsistently    Cueing Cueing Techniques: Verbal cues, Gestural cues, Tactile cues, Visual cues  Exercises Other Exercises Other Exercises: Pt engaged in grooming  tasks while standing at sink (wash face & hands). Other Exercises: Pt engaged in seated balance activities to correct L lateral lean (reaching to R across midline with LUE) as well in standing (reaching across for objects on counter).    General Comments        Pertinent Vitals/Pain Pain Assessment Pain Assessment: Faces Faces Pain Scale: Hurts whole lot Pain Location: BLE feet, knees, hands Pain Descriptors / Indicators: Sore Pain  Intervention(s): Monitored during session, Repositioned    Home Living                          Prior Function            PT Goals (current goals can now be found in the care plan section) Acute Rehab PT Goals Patient Stated Goal: to go home PT Goal Formulation: With patient Time For Goal Achievement: 08/30/23 Potential to Achieve Goals: Poor Progress towards PT goals: Progressing toward goals    Frequency    Min 2X/week      PT Plan      Co-evaluation              AM-PAC PT 6 Clicks Mobility   Outcome Measure  Help needed turning from your back to your side while in a flat bed without using bedrails?: A Lot Help needed moving from lying on your back to sitting on the side of a flat bed without using bedrails?: Total Help needed moving to and from a bed to a chair (including a wheelchair)?: Total Help needed standing up from a chair using your arms (e.g., wheelchair or bedside chair)?: Total Help needed to walk in hospital room?: Total Help needed climbing 3-5 steps with a railing? : Total 6 Click Score: 7    End of Session   Activity Tolerance: Patient limited by fatigue;Patient limited by pain Patient left: in bed;with call bell/phone within reach;with bed alarm set Nurse Communication: Mobility status PT Visit Diagnosis: Other abnormalities of gait and mobility (R26.89);Difficulty in walking, not elsewhere classified (R26.2);Muscle weakness (generalized) (M62.81);Unsteadiness on feet (R26.81)     Time: 8662-8598 PT Time Calculation (min) (ACUTE ONLY): 24 min  Charges:    $Therapeutic Activity: 23-37 mins PT General Charges $$ ACUTE PT VISIT: 1 Visit                     Richerd Pinal, PT, DPT 08/19/23, 2:14 PM    Richerd CHRISTELLA Pinal 08/19/2023, 2:12 PM

## 2023-08-19 NOTE — Discharge Summary (Signed)
 Physician Discharge Summary   Patient: Krystal [name] MRN: [id number] DOB: 1940-09-18  Admit date:     08/15/2023  Discharge date: 08/19/23  Discharge Physician: Drue ONEIDA Potter   PCP: Dreama Alan Kaufmann, FNP   Recommendations at discharge:  Follow-up with urology  Discharge Diagnoses:   Complicated UTI: likely secondary to foley. 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 Pt is a poor historian and hx was obtain mostly from chart review, ER physician and pt's son in law, [place]. Pt was sent from [facility] for transient hypotension as per ER physician. Pt denies any fever, chills, sweating, cough, shortness of breath, chest pain, nausea, vomiting, dysuria, diarrhea or constipation. Pt has foley that was possibly placed 8 days ago in [facility] as per pt's son in law, [place], for urinary retention. Foley was exchanged in the ER on 08/15/23 as per ER physician.  Patient's urine grew yeast for which she will complete a course of fluconazole .  She will follow-up with urologist # urinary retention with Foley in place.  Consultants: none Procedures performed: none  Disposition: Skilled nursing facility Diet recommendation:  Cardiac diet DISCHARGE MEDICATION: Allergies as of 08/19/2023       Reactions   Rsv Mrna Pre-f Virus Vaccine    Body aches and pain, nausea and weakness        Medication List     STOP taking these medications    amLODipine 5 MG tablet Commonly known as: NORVASC   enalapril 20 MG tablet Commonly known as: VASOTEC   lidocaine  5 % Commonly known as: Lidoderm    Omega-3 1000 MG Caps   oxyCODONE   5 MG immediate release tablet Commonly known as: Roxicodone    PreserVision AREDS Caps       TAKE these medications    acetaminophen  500 MG tablet Commonly known as: TYLENOL  Take 500 mg by mouth every 6 (six) hours as needed for mild pain (pain score 1-3).   alendronate 70 MG tablet Commonly known as: FOSAMAX Take 70 mg by mouth once a week. sunday   aspirin  EC 81 MG tablet Take 81 mg by mouth daily.   fluconazole  100 MG tablet Commonly known as: DIFLUCAN  Take 1 tablet (100 mg total) by mouth at bedtime for 7 days.   polyethylene glycol 17 g packet Commonly known as: MIRALAX / GLYCOLAX Take 17 g by mouth daily as needed for mild constipation.   pregabalin  25 MG capsule Commonly known as: LYRICA  Take 25 mg by mouth 2 (two) times daily.   SALMON OIL PO Take 1 capsule by mouth daily.   sennosides-docusate sodium  8.6-50 MG tablet Commonly known as: SENOKOT-S Take 1 tablet by mouth in the morning and at bedtime.   sertraline  50 MG tablet Commonly known as: ZOLOFT  Take 1 tablet by mouth daily.   simvastatin  20 MG tablet Commonly known as: ZOCOR  Take 1 tablet by mouth at bedtime.   sodium chloride  0.9 % infusion Inject into the vein once.        Contact information for follow-up providers     Dreama Alan Kaufmann, FNP Follow up.   Specialty: Family Medicine Why: hospital follow up Contact information: [address] [place] [address] [phone]  Contact information for after-discharge care     Destination     [facility] .   Service: Skilled Nursing Contact information: [address] [place]  [address] [phone]                    Discharge Exam: Filed Weights   08/15/23 0814  Weight: 47.4 kg   General exam: Appears calm and comfortable  Respiratory system: Clear to auscultation. Respiratory effort normal. Cardiovascular system: S1 & S2+. No  rubs,  gallops or clicks.  Gastrointestinal system: Abdomen is nondistended, soft and nontender. Normal bowel sounds heard. Central nervous system: Alert and awake. Moves all extremities  Psychiatry: Judgement and insight appears poor. Flat mood and affect      Condition at discharge: good  The results of significant diagnostics from this hospitalization (including imaging, microbiology, ancillary and laboratory) are listed below for reference.   Imaging Studies: DG Chest 2 View Result Date: 08/15/2023 CLINICAL DATA:  Hypotension EXAM: CHEST - 2 VIEW COMPARISON:  Chest radiograph dated 01/09/2023, CT chest dated 05/05/2023 FINDINGS: Similar asymmetric elevation of the right hemidiaphragm. Normal lung volumes. No focal consolidations. No pleural effusion or pneumothorax. Normal cardiomediastinal silhouette status post aortic valve replacement. Similar old right lateral rib fractures. Interposed colon below the right hemidiaphragm. IMPRESSION: 1. No active cardiopulmonary disease. 2. Similar asymmetric elevation of the right hemidiaphragm. Electronically Signed   By: Limin  Xu M.D.   On: 08/15/2023 09:48    Microbiology: Results for orders placed or performed during the hospital encounter of 08/15/23  Urine Culture     Status: Abnormal   Collection Time: 08/15/23  8:20 AM   Specimen: Urine, Catheterized  Result Value Ref Range Status   Specimen Description   Final    URINE, CATHETERIZED Performed at [facility], [address] [place], [place] [address]    Special Requests   Final    Normal Performed at [facility], [address] [place], [place] [address]    Culture 70,000 COLONIES/mL YEAST (A)  Final   Report Status 08/16/2023 FINAL  Final  Blood culture (routine x 2)     Status: None (Preliminary result)   Collection Time: 08/15/23  5:17 PM   Specimen: BLOOD  Result Value Ref Range Status   Specimen Description BLOOD BLOOD LEFT ARM  Final   Special Requests    Final    BOTTLES DRAWN AEROBIC AND ANAEROBIC Blood Culture adequate volume   Culture   Final    NO GROWTH 4 DAYS Performed at [facility], [address], [place], [place] [address]    Report Status PENDING  Incomplete  Blood culture (routine x 2)     Status: None (Preliminary result)   Collection Time: 08/15/23  9:20 PM   Specimen: BLOOD  Result Value Ref Range Status   Specimen Description BLOOD BLOOD LEFT HAND  Final   Special Requests   Final    BOTTLES DRAWN AEROBIC AND ANAEROBIC Blood Culture adequate volume   Culture   Final    NO GROWTH 4 DAYS Performed at [facility], [address] [place], [place] [address]    Report Status PENDING  Incomplete    Labs: CBC: Recent Labs  Lab 08/15/23 0820 08/16/23 0527 08/17/23 0436 08/18/23 0520 08/19/23 0429  WBC 7.6 6.6 8.1 5.1 7.8  NEUTROABS 5.5  --   --  3.6 6.4  HGB 9.7* 9.8* 11.0* 10.1* 9.8*  HCT  30.4* 30.9* 33.3* 30.9* 30.0*  MCV 95.6 95.1 92.2 92.2 93.5  PLT 157 150 151 141* 147*   Basic Metabolic Panel: Recent Labs  Lab 08/15/23 0820 08/16/23 0527 08/17/23 0436 08/18/23 0520 08/19/23 0429  NA 141 140 138 140 139  K 4.0 4.2 3.7 3.6 3.4*  CL 112* 111 108 111 112*  CO2 21* 22 22 20* 20*  GLUCOSE 85 70 93 76 103*  BUN 43* 29* 15 12 11   CREATININE 1.42* 0.86 0.71 0.67 0.61  CALCIUM 8.2* 8.2* 8.6* 8.2* 8.1*  MG  --  1.9  --   --   --   PHOS  --  2.3*  --   --   --    Liver Function Tests: Recent Labs  Lab 08/15/23 0820 08/16/23 0527  AST 15 15  ALT 11 11  ALKPHOS 83 84  BILITOT 0.3 0.4  PROT 5.2* 5.3*  ALBUMIN 2.6* 2.6*   CBG: Recent Labs  Lab 08/15/23 0818  GLUCAP 75    Discharge time spent:  37 minutes.  Signed: Drue ONEIDA Potter, MD [facility] 08/19/2023

## 2023-08-20 LAB — CULTURE, BLOOD (ROUTINE X 2)
Culture: NO GROWTH
Culture: NO GROWTH
Special Requests: ADEQUATE
Special Requests: ADEQUATE

## 2023-09-12 DIAGNOSIS — R338 Other retention of urine: Secondary | ICD-10-CM | POA: Insufficient documentation

## 2023-09-12 NOTE — Progress Notes (Unsigned)
   09/15/23 9:17 AM   Slater LOISE Collet 1940/10/07 [id number]  CC: urinary retention / Foley   HPI: Initial visit today re: AUR during hospitalization in July 2025 Admitted with AMS + AUR (~700 cc), significant constipation  rectal stool ball noted Foley placed at [facility] (~7/20) +70k yeast Ucx (08/15/23) s/p Fluconazole   Today, she is accompanied by her son-in-law and his wife.  Patient is pleasant and seems to be in good spirits.  She has recovered from her hospitalization in July.  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  Family History: Family History  Problem Relation Age of Onset   Heart disease Mother    Diabetes Father    Heart disease Father    Breast cancer Sister    Breast cancer Sister     Social History:  reports that she quit smoking about 41 years ago. Her smoking use included cigarettes. She started smoking about 65 years ago. She has a 24 pack-year smoking history. She has never used smokeless tobacco. She reports current drug use. Drug: Oxycodone . She reports that she does not drink alcohol.  Physical Exam: BP 125/71   Pulse 81   Ht 5' (1.524 m)   Wt 104 lb (47.2 kg)   BMI 20.31 kg/m    Constitutional:  Alert and oriented, No acute distress. Cardiovascular: No clubbing, cyanosis, or edema. Respiratory: Normal respiratory effort, no increased work of breathing. GI: Abdomen is soft, nontender, nondistended, no abdominal masses GU: indwelling catheter in place, light yellow urine Skin: No  rashes, bruises or suspicious lesions. Neurologic: Grossly intact, no focal deficits, moving all 4 extremities. Psychiatric: Normal mood and affect.  Laboratory Data:  Latest Reference Range & Units 08/19/23 04:29  Creatinine 0.44 - 1.00 mg/dL 9.38   Ucx 2/71/74 - 29x yeast - ~1 week with indwelling catheter  Assessment & Plan:    Acute urinary retention Assessment & Plan: AUR in late July 2025 - 700cc  During admit for AMS, w/ constipation / rectal stool burden  Foley placed   +70k yeast Ucx (~after 1 week with indwelling catheter)  - s/p fluconazole    Recommend a TOV today  Single dose 150mg  Diflucan  + 500mg  Levaquin   Bowel regimen to reduce constipation /stool burden - likely primary contributor  Bladder hygiene - timed and double voids to ensure optimal emptying  RTC this afternoon for PVR   Other orders -     Fluconazole ; Take 1 tablet (150 mg total) by mouth once for 1 dose.  Dispense: 1 tablet; Refill: 0 -     levoFLOXacin ; Take 1 tablet (500 mg total) by mouth once for 1 dose.  Dispense: 1 tablet; Refill: 0      Penne Skye, MD 09/15/2023  [facility] [address] [place], [place] [address] [phone]

## 2023-09-12 NOTE — Assessment & Plan Note (Signed)
 AUR in late July 2025 - 700cc  During admit for AMS, w/ constipation / rectal stool burden  Foley placed   +70k yeast Ucx (~after 1 week with indwelling catheter)  - s/p fluconazole    Recommend a TOV today  Single dose 150mg  Diflucan  + 500mg  Levaquin   Bowel regimen to reduce constipation /stool burden - likely primary contributor  Bladder hygiene - timed and double voids to ensure optimal emptying  RTC this afternoon for PVR

## 2023-09-15 ENCOUNTER — Ambulatory Visit: Admitting: Urology

## 2023-09-15 VITALS — BP 125/71 | HR 81 | Ht 60.0 in | Wt 104.0 lb

## 2023-09-15 DIAGNOSIS — R338 Other retention of urine: Secondary | ICD-10-CM

## 2023-09-15 MED ORDER — LEVOFLOXACIN 500 MG PO TABS: 500.0000 mg | ORAL_TABLET | Freq: Once | ORAL | 0 refills | Status: DC

## 2023-09-15 MED ORDER — FLUCONAZOLE 150 MG PO TABS: 150.0000 mg | ORAL_TABLET | Freq: Once | ORAL | 0 refills | Status: DC

## 2023-09-15 MED ORDER — FLUCONAZOLE 150 MG PO TABS: 150.0000 mg | ORAL_TABLET | Freq: Once | ORAL | 0 refills | Status: AC

## 2023-09-15 MED ORDER — LEVOFLOXACIN 500 MG PO TABS
500.0000 mg | ORAL_TABLET | Freq: Once | ORAL | Status: AC
  Administered 2023-09-15: 500 mg via ORAL

## 2023-09-15 NOTE — Addendum Note (Signed)
 Addended byBETHA CORIE PLATER on: 09/15/2023 03:34 PM   Modules accepted: Orders

## 2023-09-15 NOTE — Progress Notes (Addendum)
 Catheter Removal  Patient is present today for a catheter removal.  10ml of water was drained from the balloon. A 14FR foley cath was removed from the bladder, no complications were noted. Patient tolerated well.  Performed by: Myrtha Dante LATHER. Humberta Magallon-Mariche, RMA.  Follow up/ Additional notes: Patient is expected to return to the clinic in the afternoon @2 :45. Patient arrived back at the clinic first pvr was second pvr scan was .   - I reviewed the follow up voids/PVRs this afternoon. While not perfect, I think she is safe to follow expectantly for now. 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 If any episodes of florid, symptomatic AUR or new UTIs, we will need to discuss a longer term plan, catheter, CIC, etc. - Dr. Georganne

## 2023-09-15 NOTE — Addendum Note (Signed)
 Addended byBETHA CORIE PLATER on: 09/15/2023 03:23 PM   Modules accepted: Orders

## 2023-09-21 ENCOUNTER — Ambulatory Visit
Admission: RE | Admit: 2023-09-21 | Discharge: 2023-09-21 | Disposition: A | Discharge status: Home / Self Care | Source: Ambulatory Visit | Attending: Oncology | Admitting: Oncology

## 2023-09-21 DIAGNOSIS — R911 Solitary pulmonary nodule: Secondary | ICD-10-CM | POA: Diagnosis present

## 2023-09-28 ENCOUNTER — Ambulatory Visit: Admitting: Physician Assistant

## 2023-10-05 ENCOUNTER — Inpatient Hospital Stay: Attending: Oncology | Admitting: Oncology

## 2023-10-05 ENCOUNTER — Ambulatory Visit
Admission: RE | Admit: 2023-10-05 | Discharge: 2023-10-05 | Disposition: A | Discharge status: Home / Self Care | Source: Ambulatory Visit | Attending: Radiation Oncology | Admitting: Radiation Oncology

## 2023-10-05 ENCOUNTER — Encounter: Payer: Self-pay | Admitting: Radiation Oncology

## 2023-10-05 ENCOUNTER — Ambulatory Visit: Admitting: Physician Assistant

## 2023-10-05 VITALS — BP 137/79 | HR 67 | Ht 60.0 in | Wt 104.1 lb

## 2023-10-05 VITALS — BP 136/58 | HR 66 | Temp 95.7°F | Resp 16

## 2023-10-05 DIAGNOSIS — Z85118 Personal history of other malignant neoplasm of bronchus and lung: Secondary | ICD-10-CM

## 2023-10-05 DIAGNOSIS — Z87891 Personal history of nicotine dependence: Secondary | ICD-10-CM | POA: Insufficient documentation

## 2023-10-05 DIAGNOSIS — Z803 Family history of malignant neoplasm of breast: Secondary | ICD-10-CM | POA: Insufficient documentation

## 2023-10-05 DIAGNOSIS — Z08 Encounter for follow-up examination after completed treatment for malignant neoplasm: Secondary | ICD-10-CM | POA: Diagnosis not present

## 2023-10-05 DIAGNOSIS — R918 Other nonspecific abnormal finding of lung field: Secondary | ICD-10-CM | POA: Diagnosis not present

## 2023-10-05 DIAGNOSIS — R338 Other retention of urine: Secondary | ICD-10-CM | POA: Diagnosis not present

## 2023-10-05 DIAGNOSIS — R911 Solitary pulmonary nodule: Secondary | ICD-10-CM | POA: Insufficient documentation

## 2023-10-05 DIAGNOSIS — M4854XA Collapsed vertebra, not elsewhere classified, thoracic region, initial encounter for fracture: Secondary | ICD-10-CM | POA: Insufficient documentation

## 2023-10-05 DIAGNOSIS — Z923 Personal history of irradiation: Secondary | ICD-10-CM | POA: Insufficient documentation

## 2023-10-05 LAB — BLADDER SCAN AMB NON-IMAGING

## 2023-10-05 NOTE — Progress Notes (Signed)
 Hematology/Oncology Consult note [facility]  [phone] [phone]  Patient Care Team: Dreama Alan Kaufmann, FNP as PCP - General (Family Medicine) Melanee Annah BROCKS, MD as Consulting Physician (Oncology) Verdene Gills, RN as Oncology Nurse Navigator Lenn Aran, MD as Consulting Physician (Radiation Oncology)   Name of the patient: Krystal [name]  [id number]  1940/08/09   Date of visit: 10/05/23  Diagnosis-stage I right middle lobe lung cancer presumed based on radiology s/p SBRT  Chief complaint/ Reason for visit-routine follow-up of lung cancer  Heme/Onc history:  Patient is a 83 year old female with a past medical history significant for hypertension hyperlipidemia and longstanding history of smoking who had a fall at home sometime in early December 2024. Following that patient was in a rehab and had a second fall at the rehab on 01/09/2023 and was brought to the ER. She underwent CT chest abdomen and pelvis with contrast at that time which incidentally showed a spiculated nodule in the inferior right middle lobe measuring 1.8 x 1.5 cm. No evidence of enlarged mediastinal hilar or axillary adenopathy or distant metastatic disease. Minimally displaced fractures of the posterolateral right 8th through 10th ribs.    Given her age and frailty as well as location of the lesion it was not amenable to biopsy.  Case was discussed at tumor board and plan was to proceed with empiric SBRT to the nodule which she received in January 2025.    Interval history-patient has had 2 back-to-back hospitalizations and was recently discharged to long-term facility in August 2025.  At that time she was admitted for UTI.  ECOG PS- 3 Pain scale- 0   Review of systems- Review of Systems  Constitutional:  Negative for chills, fever, malaise/fatigue and weight loss.  HENT:  Negative for congestion, ear discharge and nosebleeds.   Eyes:  Negative for  blurred vision.  Respiratory:  Negative for cough, hemoptysis, sputum production, shortness of breath and wheezing.   Cardiovascular:  Negative for chest pain, palpitations, orthopnea and claudication.  Gastrointestinal:  Negative for abdominal pain, blood in stool, constipation, diarrhea, heartburn, melena, nausea and vomiting.  Genitourinary:  Negative for dysuria, flank pain, frequency, hematuria and urgency.  Musculoskeletal:  Negative for back pain, joint pain and myalgias.  Skin:  Negative for rash.  Neurological:  Negative for dizziness, tingling, focal weakness, seizures, weakness and headaches.  Endo/Heme/Allergies:  Does not bruise/bleed easily.  Psychiatric/Behavioral:  Negative for depression and suicidal ideas. The patient does not have insomnia.       Allergies  Allergen Reactions   Rsv Mrna Pre-F Virus Vaccine     Body aches and pain, nausea and weakness     Past Medical History:  Diagnosis Date   Anxiety    HTN (hypertension)    Hyperlipidemia    Subarachnoid hemorrhage (HCC) 12/30/2022     No past surgical history on file.  Social History   Socioeconomic History   Marital status: Divorced    Spouse name: Not on file   Number of children: Not on file   Years of education: Not on file   Highest education level: Not on file  Occupational History   Not on file  Tobacco Use   Smoking status: Former    Current packs/day: 0.00    Average packs/day: 1 pack/day for 24.0 years (24.0 ttl pk-yrs)    Types: Cigarettes    Start date: 74    Quit date: 57    Years since quitting: 74.7  Smokeless tobacco: Never  Vaping Use   Vaping status: Never Used  Substance and Sexual Activity   Alcohol use: Never   Drug use: Yes    Types: Oxycodone     Comment: for pain management due to fall   Sexual activity: Not on file  Other Topics Concern   Not on file  Social History Narrative   Not on file   Social Drivers of Health   Financial Resource Strain: Low  Risk  (05/18/2023)   Received from [facility]   Overall Financial Resource Strain (CARDIA)    Difficulty of Paying Living Expenses: Not very hard  Food Insecurity: No Food Insecurity (08/15/2023)   Hunger Vital Sign    Worried About Running Out of Food in the Last Year: Never true    Ran Out of Food in the Last Year: Never true  Transportation Needs: No Transportation Needs (08/15/2023)   PRAPARE - Administrator, Civil Service (Medical): No    Lack of Transportation (Non-Medical): No  Physical Activity: Not on file  Stress: Not on file  Social Connections: Socially Isolated (08/15/2023)   Social Connection and Isolation Panel    Frequency of Communication with Friends and Family: More than three times a week    Frequency of Social Gatherings with Friends and Family: More than three times a week    Attends Religious Services: Never    Database administrator or Organizations: No    Attends Banker Meetings: Never    Marital Status: Divorced  Catering manager Violence: Not At Risk (08/15/2023)   Humiliation, Afraid, Rape, and Kick questionnaire    Fear of Current or Ex-Partner: No    Emotionally Abused: No    Physically Abused: No    Sexually Abused: No    Family History  Problem Relation Age of Onset   Heart disease Mother    Diabetes Father    Heart disease Father    Breast cancer Sister    Breast cancer Sister      Current Outpatient Medications:    acetaminophen  (TYLENOL ) 500 MG tablet, Take 500 mg by mouth every 6 (six) hours as needed for mild pain (pain score 1-3)., Disp: , Rfl:    alendronate (FOSAMAX) 70 MG tablet, Take 70 mg by mouth once a week. sunday, Disp: , Rfl:    aspirin  EC 81 MG tablet, Take 81 mg by mouth daily., Disp: , Rfl:    hydrOXYzine (ATARAX) 10 MG tablet, Take by mouth., Disp: , Rfl:    mirtazapine (REMERON) 15 MG tablet, Take 15 mg by mouth at bedtime., Disp: , Rfl:    Nutritional Supplements (SALMON OIL PO), Take 1  capsule by mouth daily., Disp: , Rfl:    polyethylene glycol (MIRALAX / GLYCOLAX) 17 g packet, Take 17 g by mouth daily as needed for mild constipation., Disp: , Rfl:    pregabalin  (LYRICA ) 25 MG capsule, Take 25 mg by mouth 2 (two) times daily., Disp: , Rfl:    sennosides-docusate sodium  (SENOKOT-S) 8.6-50 MG tablet, Take 1 tablet by mouth in the morning and at bedtime., Disp: , Rfl:    sertraline  (ZOLOFT ) 50 MG tablet, Take 1 tablet by mouth daily., Disp: , Rfl:    simvastatin  (ZOCOR ) 20 MG tablet, Take 1 tablet by mouth at bedtime., Disp: , Rfl:    sodium chloride  0.9 % infusion, Inject into the vein once., Disp: , Rfl:    tamsulosin (FLOMAX) 0.4 MG CAPS capsule, Take by  mouth., Disp: , Rfl:   Physical exam:  Vitals:   10/05/23 1138  BP: (!) 136/58  Pulse: 66  Resp: 16  Temp: (!) 95.7 F (35.4 C)  TempSrc: Tympanic  SpO2: 95%   Physical Exam Cardiovascular:     Rate and Rhythm: Normal rate and regular rhythm.     Heart sounds: Normal heart sounds.  Pulmonary:     Effort: Pulmonary effort is normal.     Breath sounds: Normal breath sounds.  Skin:    General: Skin is warm and dry.  Neurological:     Mental Status: She is alert and oriented to person, place, and time.      I have personally reviewed labs listed below:    Latest Ref Rng & Units 08/19/2023    4:29 AM  CMP  Glucose 70 - 99 mg/dL 896   BUN 8 - 23 mg/dL 11   Creatinine 9.55 - 1.00 mg/dL 9.38   Sodium 864 - 854 mmol/L 139   Potassium 3.5 - 5.1 mmol/L 3.4   Chloride 98 - 111 mmol/L 112   CO2 22 - 32 mmol/L 20   Calcium 8.9 - 10.3 mg/dL 8.1       Latest Ref Rng & Units 08/19/2023    4:29 AM  CBC  WBC 4.0 - 10.5 K/uL 7.8   Hemoglobin 12.0 - 15.0 g/dL 9.8   Hematocrit 63.9 - 46.0 % 30.0   Platelets 150 - 400 K/uL 147    I have personally reviewed Radiology images listed below: No images are attached to the encounter.  CT Chest Wo Contrast Result Date: 09/29/2023 CLINICAL DATA:  Follow-up lung nodule.   Status post radiation. EXAM: CT CHEST WITHOUT CONTRAST TECHNIQUE: Multidetector CT imaging of the chest was performed following the standard protocol without IV contrast. RADIATION DOSE REDUCTION: This exam was performed according to the departmental dose-optimization program which includes automated exposure control, adjustment of the mA and/or kV according to patient size and/or use of iterative reconstruction technique. COMPARISON:  Chest CT dated 05/05/2023. FINDINGS: Evaluation of this exam is limited in the absence of intravenous contrast as well as due to respiratory motion. Cardiovascular: There is no cardiomegaly or pericardial effusion. There is coronary vascular calcification. Aortic valve repair. There is moderate atherosclerotic calcification of the thoracic aorta. No aneurysmal dilatation. The central pulmonary arteries are grossly unremarkable. Mediastinum/Nodes: No hilar or mediastinal adenopathy. The esophagus is grossly unremarkable. Bilateral thyroid  hypodense nodules as seen previously. No mediastinal fluid collection. Lungs/Pleura: There is eventration of the right hemidiaphragm. There is right middle lobe atelectasis/scarring as seen previously. No new consolidation, pleural effusion, pneumothorax. No suspicious lung nodules. The previously seen nodule in the inferior right middle lobe is not evaluated and not seen due to atelectasis of the lung. Interval resolution of the previously seen tiny micro nodularity in the anterior left upper lobe. The central airways are patent. Upper Abdomen: Gallstones. Irregular liver contour suggestive of cirrhosis. Musculoskeletal: Osteopenia with degenerative changes of the spine and scoliosis. Age indeterminate compression fracture of the inferior endplate of T10, new since the prior CT. Correlation with clinical exam and point tenderness recommended IMPRESSION: 1. No acute intrathoracic pathology. 2. Right middle lobe atelectasis/scarring as seen previously.  No suspicious lung nodules. 3. Cholelithiasis. 4. Age indeterminate compression fracture of the inferior endplate of T10, new since the prior CT. Correlation with clinical exam and point tenderness recommended. 5.  Aortic Atherosclerosis (ICD10-I70.0). Electronically Signed   By: Vanetta Shelia HERO.D.  On: 09/29/2023 17:52     Assessment and plan- Patient is a 83 y.o. female here for surveillance visit of lung cancer  I have reviewed CT chest images    Visit Diagnosis 1. Encounter for follow-up surveillance of lung cancer      Dr. Annah Skene, MD, MPH [facility] [phone] 10/05/2023 2:02 PM

## 2023-10-05 NOTE — Progress Notes (Signed)
 Radiation Oncology Follow up Note  Name: Krystal [name]   Date:   10/05/2023 MRN:  [id number] DOB: 18-Oct-1940    This 83 y.o. female presents to the clinic today for 25-month follow-up status post SBRT for probable stage I non-small cell lung cancer of the right middle lobe.  REFERRING PROVIDER: Dreama Alan Pollen*  HPI: Patient is a 83 year old female now out 7 months having completed SBRT for a probable stage I non-small cell lung cancer of the right middle lobe.  Seen today in routine follow-up she is doing well specifically denies cough hemoptysis chest tightness any dysphagia or any change in her pulmonary status.  She had a recent chest x-ray showing.  No acute intrathoracic pathology right middle lobe atelectasis and scarring no suspicious lung nodules.  She does have compression fracture of T10.  COMPLICATIONS OF TREATMENT: none  FOLLOW UP COMPLIANCE: keeps appointments   PHYSICAL EXAM:  BP (!) 136/58   Pulse 66   Temp (!) 95.7 F (35.4 C) (Tympanic)   Resp 16  Frail-appearing elderly female wheelchair-bound in NAD.  Well-developed well-nourished patient in NAD. HEENT reveals PERLA, EOMI, discs not visualized.  Oral cavity is clear. No oral mucosal lesions are identified. Neck is clear without evidence of cervical or supraclavicular adenopathy. Lungs are clear to A&P. Cardiac examination is essentially unremarkable with regular rate and rhythm without murmur rub or thrill. Abdomen is benign with no organomegaly or masses noted. Motor sensory and DTR levels are equal and symmetric in the upper and lower extremities. Cranial nerves II through XII are grossly intact. Proprioception is intact. No peripheral adenopathy or edema is identified. No motor or sensory levels are noted. Crude visual fields are within normal range.  RADIOLOGY RESULTS: CT scans reviewed compatible with above-stated findings  PLAN: Present time patient is doing well no no pathology by CT criteria in her chest.   I have asked to see her back in 1 year for follow-up with a repeat CT scan of her chest at that time.  Patient and family comprehend the recommendations well.  They know to call with any concerns.  I would like to take this opportunity to thank you for allowing me to participate in the care of your patient.SABRA Marcey Penton, MD

## 2023-10-05 NOTE — Progress Notes (Signed)
 10/05/2023 3:06 PM   Slater LOISE Collet 11/12/1940 [id number]  CC: Chief Complaint  Patient presents with   Follow-up   Urinary Retention   HPI: Krystal [name] is a 83 y.o. female with a recent history of urinary retention in the setting of constipation who passed an outpatient voiding trial with Dr. Georganne last month who presents today for follow-up.  She is accompanied today by her daughter and ex son-in-law.  Today she reports she continues to void without difficulty.  No urinary incontinence.  She admits to poor p.o. hydration.  Bladder scan , last void several hours ago.  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  Home Medications:  Allergies as of 10/05/2023       Reactions   Rsv Mrna Pre-f Virus Vaccine    Body aches and pain, nausea and weakness        Medication List        Accurate as of October 05, 2023  3:06 PM. If you have any questions, ask your nurse or doctor.          acetaminophen  500 MG tablet Commonly known as: TYLENOL  Take 500 mg by mouth every 6 (six) hours as needed for mild pain (pain score 1-3).   alendronate 70 MG tablet Commonly known as: FOSAMAX Take 70 mg by mouth once a week. sunday   aspirin  EC 81 MG tablet Take 81 mg by mouth daily.   hydrOXYzine 10 MG tablet Commonly known as: ATARAX Take by mouth.   mirtazapine 15 MG tablet Commonly known as: REMERON Take 15 mg by mouth at bedtime.   polyethylene glycol 17 g packet Commonly known as: MIRALAX / GLYCOLAX Take 17 g by mouth daily as needed for mild constipation.   pregabalin  25 MG capsule Commonly known as: LYRICA  Take 25 mg by mouth 2 (two) times daily.   SALMON OIL PO Take 1 capsule by mouth daily.   sennosides-docusate sodium  8.6-50 MG tablet Commonly known as: SENOKOT-S Take 1 tablet by mouth in  the morning and at bedtime.   sertraline  50 MG tablet Commonly known as: ZOLOFT  Take 1 tablet by mouth daily.   simvastatin  20 MG tablet Commonly known as: ZOCOR  Take 1 tablet by mouth at bedtime.   sodium chloride  0.9 % infusion Inject into the vein once.   tamsulosin 0.4 MG Caps capsule Commonly known as: FLOMAX Take by mouth.        Allergies:  Allergies  Allergen Reactions   Rsv Mrna Pre-F Virus Vaccine     Body aches and pain, nausea and weakness    Family History: Family History  Problem Relation Age of Onset   Heart disease Mother    Diabetes Father    Heart disease Father    Breast cancer Sister    Breast cancer Sister     Social History:   reports that she quit smoking about 41 years ago. Her smoking use included cigarettes. She started smoking about 65 years ago. She has a 24 pack-year smoking history. She has never used smokeless tobacco. She reports current drug use. Drug: Oxycodone . She reports that she does not drink alcohol.  Physical Exam: BP 137/79 (BP Location: Right Arm, Patient Position: Sitting, Cuff Size: Normal)   Pulse 67   Ht 5' (1.524 m)   Wt  104 lb 1.6 oz (47.2 kg)   SpO2 98%   BMI 20.33 kg/m   Constitutional:  Alert and oriented, no acute distress, nontoxic appearing HEENT: [place], AT Cardiovascular: No clubbing, cyanosis, or edema Respiratory: Normal respiratory effort, no increased work of breathing Skin: No rashes, bruises or suspicious lesions Neurologic: Grossly intact, no focal deficits, moving all 4 extremities Psychiatric: Normal mood and affect  Laboratory Data: Results for orders placed or performed in visit on 10/05/23  BLADDER SCAN AMB NON-IMAGING   Collection Time: 10/05/23  3:15 PM  Result Value Ref Range   Scan Result    Assessment & Plan:   1. Acute urinary retention (Primary) Bladder scan appropriate given duration since last void.  She denies difficulty voiding or urinary incontinence.  She may follow-up  with us  as needed. - BLADDER SCAN AMB NON-IMAGING   Return if symptoms worsen or fail to improve.  Lucie Hones, PA-C  [facility] [place] [address] [place], [place] [address] [phone]

## 2023-10-05 NOTE — Progress Notes (Signed)
 Pt seen in rad onc today and nursing assessment done along with vital signs

## 2023-10-07 ENCOUNTER — Encounter: Payer: Self-pay | Admitting: Oncology

## 2023-10-07 ENCOUNTER — Encounter: Payer: Self-pay | Admitting: Radiation Oncology

## 2024-04-03 ENCOUNTER — Other Ambulatory Visit

## 2024-04-17 ENCOUNTER — Ambulatory Visit: Admitting: Oncology

## 2024-09-20 ENCOUNTER — Other Ambulatory Visit

## 2024-10-04 ENCOUNTER — Ambulatory Visit: Admitting: Radiation Oncology
